# Patient Record
Sex: Female | Born: 1935 | Race: White | Hispanic: No | Smoking: Never smoker
Health system: Southern US, Community
[De-identification: ages and names within clinical notes are randomized; demographics above are authoritative.]

## PROBLEM LIST (undated history)

## (undated) ENCOUNTER — Emergency Department (HOSPITAL_COMMUNITY): Discharge status: Absent without leave | Payer: Medicare Other

## (undated) DIAGNOSIS — R490 Dysphonia: Secondary | ICD-10-CM

## (undated) DIAGNOSIS — R350 Frequency of micturition: Secondary | ICD-10-CM

## (undated) DIAGNOSIS — G629 Polyneuropathy, unspecified: Secondary | ICD-10-CM

## (undated) DIAGNOSIS — Z853 Personal history of malignant neoplasm of breast: Secondary | ICD-10-CM

## (undated) DIAGNOSIS — L719 Rosacea, unspecified: Secondary | ICD-10-CM

## (undated) DIAGNOSIS — K5900 Constipation, unspecified: Secondary | ICD-10-CM

## (undated) DIAGNOSIS — M949 Disorder of cartilage, unspecified: Secondary | ICD-10-CM

## (undated) DIAGNOSIS — M810 Age-related osteoporosis without current pathological fracture: Secondary | ICD-10-CM

## (undated) DIAGNOSIS — R269 Unspecified abnormalities of gait and mobility: Secondary | ICD-10-CM

## (undated) DIAGNOSIS — R8290 Unspecified abnormal findings in urine: Secondary | ICD-10-CM

## (undated) DIAGNOSIS — S12100A Unspecified displaced fracture of second cervical vertebra, initial encounter for closed fracture: Secondary | ICD-10-CM

## (undated) DIAGNOSIS — M899 Disorder of bone, unspecified: Secondary | ICD-10-CM

## (undated) DIAGNOSIS — M4802 Spinal stenosis, cervical region: Secondary | ICD-10-CM

## (undated) DIAGNOSIS — R32 Unspecified urinary incontinence: Secondary | ICD-10-CM

## (undated) DIAGNOSIS — R209 Unspecified disturbances of skin sensation: Secondary | ICD-10-CM

## (undated) DIAGNOSIS — R609 Edema, unspecified: Secondary | ICD-10-CM

## (undated) DIAGNOSIS — G3184 Mild cognitive impairment, so stated: Secondary | ICD-10-CM

## (undated) DIAGNOSIS — G4733 Obstructive sleep apnea (adult) (pediatric): Secondary | ICD-10-CM

## (undated) DIAGNOSIS — R0602 Shortness of breath: Secondary | ICD-10-CM

## (undated) DIAGNOSIS — M542 Cervicalgia: Secondary | ICD-10-CM

## (undated) DIAGNOSIS — D580 Hereditary spherocytosis: Secondary | ICD-10-CM

## (undated) DIAGNOSIS — K769 Liver disease, unspecified: Secondary | ICD-10-CM

## (undated) DIAGNOSIS — J309 Allergic rhinitis, unspecified: Secondary | ICD-10-CM

## (undated) DIAGNOSIS — I10 Essential (primary) hypertension: Secondary | ICD-10-CM

## (undated) DIAGNOSIS — R202 Paresthesia of skin: Secondary | ICD-10-CM

## (undated) DIAGNOSIS — G5600 Carpal tunnel syndrome, unspecified upper limb: Secondary | ICD-10-CM

## (undated) DIAGNOSIS — R252 Cramp and spasm: Secondary | ICD-10-CM

## (undated) DIAGNOSIS — N281 Cyst of kidney, acquired: Secondary | ICD-10-CM

## (undated) DIAGNOSIS — R3915 Urgency of urination: Secondary | ICD-10-CM

## (undated) DIAGNOSIS — E039 Hypothyroidism, unspecified: Secondary | ICD-10-CM

## (undated) DIAGNOSIS — N319 Neuromuscular dysfunction of bladder, unspecified: Secondary | ICD-10-CM

## (undated) DIAGNOSIS — E785 Hyperlipidemia, unspecified: Secondary | ICD-10-CM

## (undated) DIAGNOSIS — S02640A Fracture of ramus of mandible, unspecified side, initial encounter for closed fracture: Secondary | ICD-10-CM

## (undated) DIAGNOSIS — G609 Hereditary and idiopathic neuropathy, unspecified: Secondary | ICD-10-CM

## (undated) DIAGNOSIS — D1809 Hemangioma of other sites: Secondary | ICD-10-CM

## (undated) DIAGNOSIS — J310 Chronic rhinitis: Secondary | ICD-10-CM

## (undated) DIAGNOSIS — M48061 Spinal stenosis, lumbar region without neurogenic claudication: Secondary | ICD-10-CM

## (undated) DIAGNOSIS — C50919 Malignant neoplasm of unspecified site of unspecified female breast: Secondary | ICD-10-CM

## (undated) DIAGNOSIS — Z9181 History of falling: Principal | ICD-10-CM

## (undated) DIAGNOSIS — M1990 Unspecified osteoarthritis, unspecified site: Secondary | ICD-10-CM

## (undated) HISTORY — DX: Edema, unspecified: R60.9

## (undated) HISTORY — DX: Constipation, unspecified: K59.00

## (undated) HISTORY — DX: Dysphonia: R49.0

## (undated) HISTORY — DX: Paresthesia of skin: R20.2

## (undated) HISTORY — DX: Malignant neoplasm of unspecified site of unspecified female breast: C50.919

## (undated) HISTORY — DX: Hereditary spherocytosis: D58.0

## (undated) HISTORY — DX: Neuromuscular dysfunction of bladder, unspecified: N31.9

## (undated) HISTORY — DX: Unspecified osteoarthritis, unspecified site: M19.90

## (undated) HISTORY — DX: Shortness of breath: R06.02

## (undated) HISTORY — DX: Cramp and spasm: R25.2

## (undated) HISTORY — DX: Mild cognitive impairment, so stated: G31.84

## (undated) HISTORY — DX: Hypothyroidism, unspecified: E03.9

## (undated) HISTORY — DX: Rosacea, unspecified: L71.9

## (undated) HISTORY — DX: Cyst of kidney, acquired: N28.1

## (undated) HISTORY — DX: Unspecified displaced fracture of second cervical vertebra, initial encounter for closed fracture: S12.100A

## (undated) HISTORY — DX: Unspecified abnormal findings in urine: R82.90

## (undated) HISTORY — DX: Obstructive sleep apnea (adult) (pediatric): G47.33

## (undated) HISTORY — DX: Carpal tunnel syndrome, unspecified upper limb: G56.00

## (undated) HISTORY — DX: Cervicalgia: M54.2

## (undated) HISTORY — DX: Allergic rhinitis, unspecified: J30.9

## (undated) HISTORY — DX: Unspecified urinary incontinence: R32

## (undated) HISTORY — DX: Disorder of cartilage, unspecified: M94.9

## (undated) HISTORY — PX: BLADDER REPAIR: SHX76

## (undated) HISTORY — DX: Hyperlipidemia, unspecified: E78.5

## (undated) HISTORY — DX: Unspecified abnormalities of gait and mobility: R26.9

## (undated) HISTORY — DX: History of falling: Z91.81

## (undated) HISTORY — DX: Age-related osteoporosis without current pathological fracture: M81.0

## (undated) HISTORY — DX: Disorder of bone, unspecified: M89.9

## (undated) HISTORY — DX: Urgency of urination: R39.15

## (undated) HISTORY — DX: Fracture of ramus of mandible, unspecified side, initial encounter for closed fracture: S02.640A

## (undated) HISTORY — DX: Liver disease, unspecified: K76.9

## (undated) HISTORY — DX: Spinal stenosis, cervical region: M48.02

## (undated) HISTORY — DX: Frequency of micturition: R35.0

## (undated) HISTORY — DX: Hemangioma of other sites: D18.09

## (undated) HISTORY — DX: Unspecified disturbances of skin sensation: R20.9

## (undated) HISTORY — DX: Spinal stenosis, lumbar region without neurogenic claudication: M48.061

## (undated) HISTORY — PX: TONSILLECTOMY: SHX5217

## (undated) HISTORY — DX: Essential (primary) hypertension: I10

## (undated) HISTORY — DX: Chronic rhinitis: J31.0

## (undated) HISTORY — DX: Polyneuropathy, unspecified: G62.9

## (undated) HISTORY — DX: Hereditary and idiopathic neuropathy, unspecified: G60.9

## (undated) HISTORY — DX: Personal history of malignant neoplasm of breast: Z85.3

---

## 1972-12-22 HISTORY — PX: ABDOMINAL HYSTERECTOMY: SHX81

## 1995-05-04 DIAGNOSIS — M899 Disorder of bone, unspecified: Secondary | ICD-10-CM

## 1995-05-04 HISTORY — DX: Disorder of bone, unspecified: M89.9

## 1996-12-22 HISTORY — PX: BREAST LUMPECTOMY: SHX2

## 1997-05-24 DIAGNOSIS — C50919 Malignant neoplasm of unspecified site of unspecified female breast: Secondary | ICD-10-CM

## 1997-05-24 HISTORY — DX: Malignant neoplasm of unspecified site of unspecified female breast: C50.919

## 1998-01-22 ENCOUNTER — Ambulatory Visit (HOSPITAL_COMMUNITY): Admission: RE | Admit: 1998-01-22 | Discharge: 1998-01-22 | Payer: Self-pay | Admitting: Hematology and Oncology

## 1998-08-28 ENCOUNTER — Encounter: Payer: Self-pay | Admitting: Hematology and Oncology

## 1998-08-28 ENCOUNTER — Ambulatory Visit (HOSPITAL_COMMUNITY): Admission: RE | Admit: 1998-08-28 | Discharge: 1998-08-28 | Payer: Self-pay | Admitting: Hematology and Oncology

## 1999-02-20 ENCOUNTER — Ambulatory Visit (HOSPITAL_COMMUNITY): Admission: RE | Admit: 1999-02-20 | Discharge: 1999-02-20 | Payer: Self-pay | Admitting: Hematology and Oncology

## 1999-02-20 ENCOUNTER — Encounter: Payer: Self-pay | Admitting: Hematology and Oncology

## 1999-02-21 ENCOUNTER — Other Ambulatory Visit: Admission: RE | Admit: 1999-02-21 | Discharge: 1999-02-21 | Payer: Self-pay | Admitting: Obstetrics and Gynecology

## 1999-02-28 ENCOUNTER — Ambulatory Visit (HOSPITAL_COMMUNITY): Admission: RE | Admit: 1999-02-28 | Discharge: 1999-02-28 | Payer: Self-pay | Admitting: Gastroenterology

## 1999-02-28 HISTORY — PX: COLONOSCOPY: SHX174

## 1999-07-23 HISTORY — PX: CATARACT EXTRACTION W/ INTRAOCULAR LENS IMPLANT: SHX1309

## 1999-08-12 ENCOUNTER — Encounter: Payer: Self-pay | Admitting: Specialist

## 1999-08-14 ENCOUNTER — Ambulatory Visit (HOSPITAL_COMMUNITY): Admission: RE | Admit: 1999-08-14 | Discharge: 1999-08-14 | Payer: Self-pay | Admitting: Specialist

## 2000-01-14 DIAGNOSIS — J309 Allergic rhinitis, unspecified: Secondary | ICD-10-CM

## 2000-01-14 DIAGNOSIS — Z853 Personal history of malignant neoplasm of breast: Secondary | ICD-10-CM

## 2000-01-14 DIAGNOSIS — R252 Cramp and spasm: Secondary | ICD-10-CM

## 2000-01-14 DIAGNOSIS — D580 Hereditary spherocytosis: Secondary | ICD-10-CM

## 2000-01-14 HISTORY — DX: Personal history of malignant neoplasm of breast: Z85.3

## 2000-01-14 HISTORY — DX: Hereditary spherocytosis: D58.0

## 2000-01-14 HISTORY — DX: Allergic rhinitis, unspecified: J30.9

## 2000-01-14 HISTORY — DX: Cramp and spasm: R25.2

## 2000-02-25 ENCOUNTER — Encounter: Payer: Self-pay | Admitting: Surgery

## 2000-02-25 ENCOUNTER — Ambulatory Visit (HOSPITAL_COMMUNITY): Admission: RE | Admit: 2000-02-25 | Discharge: 2000-02-25 | Payer: Self-pay | Admitting: Surgery

## 2000-03-26 ENCOUNTER — Other Ambulatory Visit: Admission: RE | Admit: 2000-03-26 | Discharge: 2000-03-26 | Payer: Self-pay | Admitting: Obstetrics and Gynecology

## 2000-12-22 HISTORY — PX: CORNEAL TRANSPLANT: SHX108

## 2001-03-08 ENCOUNTER — Encounter: Payer: Self-pay | Admitting: Obstetrics and Gynecology

## 2001-03-08 ENCOUNTER — Encounter: Admission: RE | Admit: 2001-03-08 | Discharge: 2001-03-08 | Payer: Self-pay | Admitting: Obstetrics and Gynecology

## 2001-03-09 DIAGNOSIS — M810 Age-related osteoporosis without current pathological fracture: Secondary | ICD-10-CM

## 2001-03-09 DIAGNOSIS — E785 Hyperlipidemia, unspecified: Secondary | ICD-10-CM

## 2001-03-09 DIAGNOSIS — M1990 Unspecified osteoarthritis, unspecified site: Secondary | ICD-10-CM

## 2001-03-09 HISTORY — DX: Hyperlipidemia, unspecified: E78.5

## 2001-03-09 HISTORY — DX: Age-related osteoporosis without current pathological fracture: M81.0

## 2001-03-09 HISTORY — DX: Unspecified osteoarthritis, unspecified site: M19.90

## 2001-03-14 ENCOUNTER — Ambulatory Visit (HOSPITAL_BASED_OUTPATIENT_CLINIC_OR_DEPARTMENT_OTHER): Admission: RE | Admit: 2001-03-14 | Discharge: 2001-03-14 | Payer: Self-pay | Admitting: Internal Medicine

## 2001-04-01 ENCOUNTER — Other Ambulatory Visit: Admission: RE | Admit: 2001-04-01 | Discharge: 2001-04-01 | Payer: Self-pay | Admitting: Obstetrics and Gynecology

## 2001-04-19 DIAGNOSIS — E039 Hypothyroidism, unspecified: Secondary | ICD-10-CM

## 2001-04-19 HISTORY — DX: Hypothyroidism, unspecified: E03.9

## 2001-10-12 ENCOUNTER — Encounter (INDEPENDENT_AMBULATORY_CARE_PROVIDER_SITE_OTHER): Payer: Self-pay

## 2001-10-12 ENCOUNTER — Other Ambulatory Visit: Admission: RE | Admit: 2001-10-12 | Discharge: 2001-10-12 | Payer: Self-pay | Admitting: *Deleted

## 2002-03-10 ENCOUNTER — Encounter: Admission: RE | Admit: 2002-03-10 | Discharge: 2002-03-10 | Payer: Self-pay | Admitting: Obstetrics and Gynecology

## 2002-03-10 ENCOUNTER — Encounter: Payer: Self-pay | Admitting: Obstetrics and Gynecology

## 2002-04-04 ENCOUNTER — Other Ambulatory Visit: Admission: RE | Admit: 2002-04-04 | Discharge: 2002-04-04 | Payer: Self-pay | Admitting: Obstetrics and Gynecology

## 2002-08-02 DIAGNOSIS — I10 Essential (primary) hypertension: Secondary | ICD-10-CM

## 2002-08-02 HISTORY — DX: Essential (primary) hypertension: I10

## 2003-03-29 ENCOUNTER — Encounter: Admission: RE | Admit: 2003-03-29 | Discharge: 2003-03-29 | Payer: Self-pay | Admitting: Obstetrics and Gynecology

## 2003-03-29 ENCOUNTER — Encounter: Payer: Self-pay | Admitting: Obstetrics and Gynecology

## 2003-04-17 ENCOUNTER — Other Ambulatory Visit: Admission: RE | Admit: 2003-04-17 | Discharge: 2003-04-17 | Payer: Self-pay | Admitting: Obstetrics and Gynecology

## 2004-04-02 ENCOUNTER — Encounter: Admission: RE | Admit: 2004-04-02 | Discharge: 2004-04-02 | Payer: Self-pay | Admitting: Internal Medicine

## 2004-11-05 ENCOUNTER — Ambulatory Visit (HOSPITAL_COMMUNITY): Admission: RE | Admit: 2004-11-05 | Discharge: 2004-11-05 | Payer: Self-pay | Admitting: Gastroenterology

## 2005-04-03 ENCOUNTER — Encounter: Admission: RE | Admit: 2005-04-03 | Discharge: 2005-04-03 | Payer: Self-pay | Admitting: Internal Medicine

## 2005-11-11 DIAGNOSIS — R269 Unspecified abnormalities of gait and mobility: Secondary | ICD-10-CM

## 2005-11-11 DIAGNOSIS — R209 Unspecified disturbances of skin sensation: Secondary | ICD-10-CM

## 2005-11-11 HISTORY — DX: Unspecified abnormalities of gait and mobility: R26.9

## 2005-11-11 HISTORY — DX: Unspecified disturbances of skin sensation: R20.9

## 2006-02-24 ENCOUNTER — Ambulatory Visit (HOSPITAL_BASED_OUTPATIENT_CLINIC_OR_DEPARTMENT_OTHER): Admission: RE | Admit: 2006-02-24 | Discharge: 2006-02-24 | Payer: Self-pay | Admitting: Internal Medicine

## 2006-03-01 ENCOUNTER — Ambulatory Visit: Payer: Self-pay | Admitting: Internal Medicine

## 2006-04-21 DIAGNOSIS — G4733 Obstructive sleep apnea (adult) (pediatric): Secondary | ICD-10-CM

## 2006-04-21 HISTORY — DX: Obstructive sleep apnea (adult) (pediatric): G47.33

## 2006-04-22 ENCOUNTER — Encounter: Admission: RE | Admit: 2006-04-22 | Discharge: 2006-04-22 | Payer: Self-pay | Admitting: Internal Medicine

## 2007-02-23 DIAGNOSIS — K5900 Constipation, unspecified: Secondary | ICD-10-CM

## 2007-02-23 DIAGNOSIS — N319 Neuromuscular dysfunction of bladder, unspecified: Secondary | ICD-10-CM

## 2007-02-23 HISTORY — DX: Neuromuscular dysfunction of bladder, unspecified: N31.9

## 2007-02-23 HISTORY — DX: Constipation, unspecified: K59.00

## 2007-04-30 ENCOUNTER — Encounter: Admission: RE | Admit: 2007-04-30 | Discharge: 2007-04-30 | Payer: Self-pay | Admitting: Internal Medicine

## 2007-05-04 ENCOUNTER — Encounter: Admission: RE | Admit: 2007-05-04 | Discharge: 2007-05-04 | Payer: Self-pay | Admitting: Internal Medicine

## 2007-08-23 DIAGNOSIS — D1809 Hemangioma of other sites: Secondary | ICD-10-CM

## 2007-08-23 DIAGNOSIS — N281 Cyst of kidney, acquired: Secondary | ICD-10-CM

## 2007-08-23 HISTORY — DX: Cyst of kidney, acquired: N28.1

## 2007-08-23 HISTORY — DX: Hemangioma of other sites: D18.09

## 2007-08-31 DIAGNOSIS — K769 Liver disease, unspecified: Secondary | ICD-10-CM

## 2007-08-31 HISTORY — DX: Liver disease, unspecified: K76.9

## 2008-02-28 LAB — HM COLONOSCOPY

## 2008-05-01 ENCOUNTER — Encounter: Admission: RE | Admit: 2008-05-01 | Discharge: 2008-05-01 | Payer: Self-pay | Admitting: Internal Medicine

## 2008-10-24 DIAGNOSIS — L719 Rosacea, unspecified: Secondary | ICD-10-CM

## 2008-10-24 HISTORY — DX: Rosacea, unspecified: L71.9

## 2009-05-04 ENCOUNTER — Encounter: Admission: RE | Admit: 2009-05-04 | Discharge: 2009-05-04 | Payer: Self-pay | Admitting: Internal Medicine

## 2009-05-08 DIAGNOSIS — G609 Hereditary and idiopathic neuropathy, unspecified: Secondary | ICD-10-CM

## 2009-05-08 HISTORY — DX: Hereditary and idiopathic neuropathy, unspecified: G60.9

## 2009-08-13 ENCOUNTER — Encounter: Admission: RE | Admit: 2009-08-13 | Discharge: 2009-08-13 | Payer: Self-pay | Admitting: Internal Medicine

## 2009-08-13 DIAGNOSIS — M48061 Spinal stenosis, lumbar region without neurogenic claudication: Secondary | ICD-10-CM

## 2009-08-13 HISTORY — DX: Spinal stenosis, lumbar region without neurogenic claudication: M48.061

## 2009-09-11 ENCOUNTER — Encounter: Admission: RE | Admit: 2009-09-11 | Discharge: 2009-09-11 | Payer: Self-pay | Admitting: Internal Medicine

## 2009-10-12 DIAGNOSIS — M48061 Spinal stenosis, lumbar region without neurogenic claudication: Secondary | ICD-10-CM | POA: Insufficient documentation

## 2009-11-20 DIAGNOSIS — M542 Cervicalgia: Secondary | ICD-10-CM

## 2009-11-20 DIAGNOSIS — R0602 Shortness of breath: Secondary | ICD-10-CM

## 2009-11-20 HISTORY — DX: Shortness of breath: R06.02

## 2009-11-20 HISTORY — DX: Cervicalgia: M54.2

## 2009-11-26 ENCOUNTER — Encounter: Admission: RE | Admit: 2009-11-26 | Discharge: 2009-11-26 | Payer: Self-pay | Admitting: Internal Medicine

## 2009-11-29 ENCOUNTER — Ambulatory Visit (HOSPITAL_COMMUNITY): Admission: RE | Admit: 2009-11-29 | Discharge: 2009-11-29 | Payer: Self-pay | Admitting: Internal Medicine

## 2010-05-30 ENCOUNTER — Encounter: Admission: RE | Admit: 2010-05-30 | Discharge: 2010-05-30 | Payer: Self-pay | Admitting: Internal Medicine

## 2010-05-30 LAB — HM MAMMOGRAPHY: HM Mammogram: NORMAL

## 2010-06-11 DIAGNOSIS — S02640A Fracture of ramus of mandible, unspecified side, initial encounter for closed fracture: Secondary | ICD-10-CM

## 2010-06-11 HISTORY — DX: Fracture of ramus of mandible, unspecified side, initial encounter for closed fracture: S02.640A

## 2010-07-30 DIAGNOSIS — R350 Frequency of micturition: Secondary | ICD-10-CM

## 2010-07-30 HISTORY — DX: Frequency of micturition: R35.0

## 2010-09-04 ENCOUNTER — Encounter: Admission: RE | Admit: 2010-09-04 | Discharge: 2010-09-04 | Payer: Self-pay | Admitting: Internal Medicine

## 2010-09-10 DIAGNOSIS — M4802 Spinal stenosis, cervical region: Secondary | ICD-10-CM

## 2010-09-10 DIAGNOSIS — S12100A Unspecified displaced fracture of second cervical vertebra, initial encounter for closed fracture: Secondary | ICD-10-CM

## 2010-09-10 HISTORY — DX: Spinal stenosis, cervical region: M48.02

## 2010-09-10 HISTORY — DX: Unspecified displaced fracture of second cervical vertebra, initial encounter for closed fracture: S12.100A

## 2010-10-04 ENCOUNTER — Encounter: Admission: RE | Admit: 2010-10-04 | Discharge: 2010-10-04 | Payer: Self-pay | Admitting: Internal Medicine

## 2010-10-09 DIAGNOSIS — R609 Edema, unspecified: Secondary | ICD-10-CM | POA: Insufficient documentation

## 2010-10-09 HISTORY — DX: Edema, unspecified: R60.9

## 2010-11-19 ENCOUNTER — Encounter: Admission: RE | Admit: 2010-11-19 | Discharge: 2010-11-19 | Payer: Self-pay | Admitting: Internal Medicine

## 2011-01-11 ENCOUNTER — Encounter: Payer: Self-pay | Admitting: Internal Medicine

## 2011-01-12 ENCOUNTER — Encounter: Payer: Self-pay | Admitting: Internal Medicine

## 2011-03-25 LAB — BLOOD GAS, ARTERIAL
Acid-Base Excess: 2.5 mmol/L — ABNORMAL HIGH (ref 0.0–2.0)
Bicarbonate: 26.4 mEq/L — ABNORMAL HIGH (ref 20.0–24.0)
TCO2: 27.6 mmol/L (ref 0–100)
pCO2 arterial: 40.3 mmHg (ref 35.0–45.0)
pH, Arterial: 7.432 — ABNORMAL HIGH (ref 7.350–7.400)
pO2, Arterial: 80.2 mmHg (ref 80.0–100.0)

## 2011-04-21 ENCOUNTER — Other Ambulatory Visit: Payer: Self-pay | Admitting: Internal Medicine

## 2011-04-21 DIAGNOSIS — Z1231 Encounter for screening mammogram for malignant neoplasm of breast: Secondary | ICD-10-CM

## 2011-05-09 NOTE — Op Note (Signed)
NAME:  Gina Bolton, Gina Bolton            ACCOUNT NO.:  [id number]   MEDICAL RECORD NO.:  [id number]          PATIENT TYPE:  AMB   LOCATION:  ENDO                         FACILITY:  [facility]   PHYSICIAN:  James L. Malon Kindle., M.D.DATE OF BIRTH:  1920-06-13   DATE OF PROCEDURE:  11/05/2004  DATE OF DISCHARGE:                                 OPERATIVE REPORT   PROCEDURE PERFORMED:  Colonoscopy.   ENDOSCOPIST:  Llana Aliment. Edwards, M.D.   MEDICATIONS:  Fentanyl 60 mcg, Versed 7 mg IV.   INDICATIONS FOR PROCEDURE:  Strong family history of colon cancer in mother.   INSTRUMENT USED:  Pediatric adjustable colonoscope.   DESCRIPTION OF PROCEDURE:  The procedure had been explained to the patient  and consent obtained.  With the patient in the left lateral decubitus  position, the pediatric adjustable Olympus video colonoscope was inserted  and advanced.  The prep was excellent and we were able to reach the cecum  without difficulty.  The ileocecal valve and appendiceal orifice were seen.  The scope was withdrawn and the cecum, ascending colon, transverse colon,  splenic flexure, descending and sigmoid colon were seen well upon removal.  No polyps or other lesions were seen.  The scope was withdrawn.  The patient  tolerated the procedure well.   ASSESSMENT:  Strong family history of colon cancer with negative  colonoscopy, V160.   PLAN:  Will recommend yearly Hemoccults and repeat procedure in five years.       JLE/MEDQ  D:  11/05/2004  T:  11/05/2004  Job:  [id number]   cc:   Lenon Curt. Chilton Si, M.D.  [address]  [place]  [place] [address]  Fax: [phone]

## 2011-05-09 NOTE — Procedures (Signed)
NAME:  Gina Bolton, Gina Bolton            ACCOUNT NO.:  [id number]   MEDICAL RECORD NO.:  [id number]          PATIENT TYPE:  OUT   LOCATION:  SLEEP CENTER                 FACILITY:  [facility]   PHYSICIAN:  Clinton D. Maple Hudson, M.D. DATE OF BIRTH:  11/12/1920   DATE OF STUDY:  02/24/2006                              NOCTURNAL POLYSOMNOGRAM   REFERRING PHYSICIAN:  Dr. Murray Hodgkins.   DATE OF STUDY:  February 24, 2006.   INDICATION FOR STUDY:  Hypersomnia with sleep apnea. Epworth sleepiness  score 11/24, BMI 24, weight 150 pounds. Home medications: Evista,  hydrochlorothiazide, Restasis eye drops, Estrace cream, metronidazole. A  diagnostic NPSG on March 14, 2001 had recorded an AHI of 34 per hour.   SLEEP ARCHITECTURE:  Short total sleep time 222 minutes with sleep  efficiency 53%. Stage I was 7%, stage II 84%, stages III and IV 9% and REM  was absent. Sleep latency 49 minutes, REM latency 261 minutes, awake after  sleep onset 144 minutes. After a brief sleep interval starting at 11 p.m.,  she was awake until almost 2 a.m. before regaining sleep and was frustrated  and sensitive to the monitoring environment although she indicated the bed  was comfortable. No bedtime medication was reported.   RESPIRATORY DATA:  Apnea-hypopnea index (AHI, RDI) 51.9 obstructive events  per hour indicating severe obstructive sleep apnea/hypopnea syndrome. This  included 2 central apneas, 138 obstructive apneas and 52 hypopneas. Events  were recorded while on right side and supine, not positional. REM AHI N/A.  Because she was unable to sustain sleep during the first half of the night,  there was insufficient time to permit CPAP titration by split protocol on  this study night.   OXYGEN DATA:  Loud intermittent snorting and snoring with oxygen  desaturation to a nadir of 68%. Mean oxygen saturation through the study was  92% on room air.   CARDIAC DATA:  Normal sinus rhythm.   MOVEMENT/PARASOMNIA:  No unusual body  movement or behavior.   IMPRESSION/RECOMMENDATIONS:  1.  Severe obstructive sleep apnea/hypopnea syndrome, apnea-hypopnea index      51.9 per hour with nonpositional events, loud intermittent snoring and      oxygen desaturation to 68%.  2.  The patient was unable to maintain sleep during the first half of the      night. Consider return for continuous      positive airway pressure titration bringing a sleep medication if      appropriate. Otherwise consider alternative therapies as clinically      indicated.      Clinton D. Maple Hudson, M.D.  Diplomate, Biomedical engineer of Sleep Medicine  Electronically Signed     CDY/MEDQ  D:  03/01/2006 16:18:05  T:  03/02/2006 19:59:04  Job:  [id number]

## 2011-08-26 DIAGNOSIS — R3915 Urgency of urination: Secondary | ICD-10-CM

## 2011-08-26 HISTORY — DX: Urgency of urination: R39.15

## 2011-09-11 ENCOUNTER — Ambulatory Visit: Payer: Self-pay

## 2011-10-08 ENCOUNTER — Ambulatory Visit
Admission: RE | Admit: 2011-10-08 | Discharge: 2011-10-08 | Disposition: A | Discharge status: Home / Self Care | Payer: Medicare Other | Source: Ambulatory Visit | Attending: Internal Medicine | Admitting: Internal Medicine

## 2011-10-08 DIAGNOSIS — Z1231 Encounter for screening mammogram for malignant neoplasm of breast: Secondary | ICD-10-CM

## 2011-12-02 DIAGNOSIS — J310 Chronic rhinitis: Secondary | ICD-10-CM

## 2011-12-02 DIAGNOSIS — G3184 Mild cognitive impairment, so stated: Secondary | ICD-10-CM

## 2011-12-02 HISTORY — DX: Mild cognitive impairment of uncertain or unknown etiology: G31.84

## 2011-12-02 HISTORY — DX: Chronic rhinitis: J31.0

## 2012-01-08 DIAGNOSIS — J310 Chronic rhinitis: Secondary | ICD-10-CM | POA: Diagnosis not present

## 2012-03-29 DIAGNOSIS — E785 Hyperlipidemia, unspecified: Secondary | ICD-10-CM | POA: Diagnosis not present

## 2012-03-29 DIAGNOSIS — I10 Essential (primary) hypertension: Secondary | ICD-10-CM | POA: Diagnosis not present

## 2012-03-29 DIAGNOSIS — G3184 Mild cognitive impairment, so stated: Secondary | ICD-10-CM | POA: Diagnosis not present

## 2012-04-06 DIAGNOSIS — I10 Essential (primary) hypertension: Secondary | ICD-10-CM | POA: Diagnosis not present

## 2012-04-06 DIAGNOSIS — M542 Cervicalgia: Secondary | ICD-10-CM | POA: Diagnosis not present

## 2012-04-06 DIAGNOSIS — E785 Hyperlipidemia, unspecified: Secondary | ICD-10-CM | POA: Diagnosis not present

## 2012-04-06 DIAGNOSIS — E039 Hypothyroidism, unspecified: Secondary | ICD-10-CM | POA: Diagnosis not present

## 2012-04-06 DIAGNOSIS — C50919 Malignant neoplasm of unspecified site of unspecified female breast: Secondary | ICD-10-CM | POA: Diagnosis not present

## 2012-06-26 DIAGNOSIS — N390 Urinary tract infection, site not specified: Secondary | ICD-10-CM | POA: Diagnosis not present

## 2012-07-06 DIAGNOSIS — R82998 Other abnormal findings in urine: Secondary | ICD-10-CM | POA: Diagnosis not present

## 2012-07-06 DIAGNOSIS — R3915 Urgency of urination: Secondary | ICD-10-CM | POA: Diagnosis not present

## 2012-08-06 DIAGNOSIS — N390 Urinary tract infection, site not specified: Secondary | ICD-10-CM | POA: Diagnosis not present

## 2012-08-06 DIAGNOSIS — R3915 Urgency of urination: Secondary | ICD-10-CM | POA: Diagnosis not present

## 2012-10-05 DIAGNOSIS — M542 Cervicalgia: Secondary | ICD-10-CM | POA: Diagnosis not present

## 2012-10-05 DIAGNOSIS — I10 Essential (primary) hypertension: Secondary | ICD-10-CM | POA: Diagnosis not present

## 2012-10-05 DIAGNOSIS — N390 Urinary tract infection, site not specified: Secondary | ICD-10-CM | POA: Diagnosis not present

## 2012-10-13 DIAGNOSIS — Z23 Encounter for immunization: Secondary | ICD-10-CM | POA: Diagnosis not present

## 2012-11-22 DIAGNOSIS — N3020 Other chronic cystitis without hematuria: Secondary | ICD-10-CM | POA: Diagnosis not present

## 2013-01-17 DIAGNOSIS — E785 Hyperlipidemia, unspecified: Secondary | ICD-10-CM | POA: Diagnosis not present

## 2013-01-17 DIAGNOSIS — R3915 Urgency of urination: Secondary | ICD-10-CM | POA: Diagnosis not present

## 2013-01-17 DIAGNOSIS — I10 Essential (primary) hypertension: Secondary | ICD-10-CM | POA: Diagnosis not present

## 2013-01-25 DIAGNOSIS — E785 Hyperlipidemia, unspecified: Secondary | ICD-10-CM | POA: Diagnosis not present

## 2013-01-25 DIAGNOSIS — G4733 Obstructive sleep apnea (adult) (pediatric): Secondary | ICD-10-CM | POA: Diagnosis not present

## 2013-01-25 DIAGNOSIS — I10 Essential (primary) hypertension: Secondary | ICD-10-CM | POA: Diagnosis not present

## 2013-01-25 DIAGNOSIS — E039 Hypothyroidism, unspecified: Secondary | ICD-10-CM | POA: Diagnosis not present

## 2013-01-25 DIAGNOSIS — C50919 Malignant neoplasm of unspecified site of unspecified female breast: Secondary | ICD-10-CM | POA: Diagnosis not present

## 2013-01-25 DIAGNOSIS — R252 Cramp and spasm: Secondary | ICD-10-CM | POA: Diagnosis not present

## 2013-01-25 DIAGNOSIS — R490 Dysphonia: Secondary | ICD-10-CM

## 2013-01-25 HISTORY — DX: Dysphonia: R49.0

## 2013-01-28 DIAGNOSIS — R490 Dysphonia: Secondary | ICD-10-CM | POA: Diagnosis not present

## 2013-01-28 DIAGNOSIS — J309 Allergic rhinitis, unspecified: Secondary | ICD-10-CM | POA: Diagnosis not present

## 2013-02-23 DIAGNOSIS — H02059 Trichiasis without entropian unspecified eye, unspecified eyelid: Secondary | ICD-10-CM | POA: Diagnosis not present

## 2013-04-25 DIAGNOSIS — T1500XA Foreign body in cornea, unspecified eye, initial encounter: Secondary | ICD-10-CM | POA: Diagnosis not present

## 2013-04-26 DIAGNOSIS — T1500XA Foreign body in cornea, unspecified eye, initial encounter: Secondary | ICD-10-CM | POA: Diagnosis not present

## 2013-05-26 DIAGNOSIS — H353190 Nonexudative age-related macular degeneration, unspecified eye, stage unspecified: Secondary | ICD-10-CM | POA: Diagnosis not present

## 2013-07-04 DIAGNOSIS — N3090 Cystitis, unspecified without hematuria: Secondary | ICD-10-CM | POA: Diagnosis not present

## 2013-08-29 DIAGNOSIS — I10 Essential (primary) hypertension: Secondary | ICD-10-CM | POA: Diagnosis not present

## 2013-08-29 DIAGNOSIS — E039 Hypothyroidism, unspecified: Secondary | ICD-10-CM | POA: Diagnosis not present

## 2013-09-06 ENCOUNTER — Non-Acute Institutional Stay: Payer: Medicare Other | Admitting: Internal Medicine

## 2013-09-06 ENCOUNTER — Encounter: Payer: Self-pay | Admitting: Internal Medicine

## 2013-09-06 VITALS — BP 132/82 | HR 62 | Ht 64.5 in | Wt 138.0 lb

## 2013-09-06 DIAGNOSIS — R202 Paresthesia of skin: Secondary | ICD-10-CM

## 2013-09-06 DIAGNOSIS — R609 Edema, unspecified: Secondary | ICD-10-CM

## 2013-09-06 DIAGNOSIS — E785 Hyperlipidemia, unspecified: Secondary | ICD-10-CM

## 2013-09-06 DIAGNOSIS — M542 Cervicalgia: Secondary | ICD-10-CM

## 2013-09-06 DIAGNOSIS — R209 Unspecified disturbances of skin sensation: Secondary | ICD-10-CM | POA: Diagnosis not present

## 2013-09-06 DIAGNOSIS — I10 Essential (primary) hypertension: Secondary | ICD-10-CM

## 2013-09-06 DIAGNOSIS — M4802 Spinal stenosis, cervical region: Secondary | ICD-10-CM | POA: Diagnosis not present

## 2013-09-06 DIAGNOSIS — E039 Hypothyroidism, unspecified: Secondary | ICD-10-CM

## 2013-09-06 DIAGNOSIS — G4733 Obstructive sleep apnea (adult) (pediatric): Secondary | ICD-10-CM

## 2013-09-06 DIAGNOSIS — R413 Other amnesia: Secondary | ICD-10-CM

## 2013-09-06 NOTE — Progress Notes (Signed)
Passed clock drawing 

## 2013-09-20 DIAGNOSIS — Z23 Encounter for immunization: Secondary | ICD-10-CM | POA: Diagnosis not present

## 2013-10-04 ENCOUNTER — Non-Acute Institutional Stay: Payer: Medicare Other | Admitting: Internal Medicine

## 2013-10-04 ENCOUNTER — Encounter: Payer: Self-pay | Admitting: Internal Medicine

## 2013-10-04 VITALS — BP 140/88 | HR 62 | Ht 64.5 in | Wt 137.0 lb

## 2013-10-04 DIAGNOSIS — S41109A Unspecified open wound of unspecified upper arm, initial encounter: Secondary | ICD-10-CM | POA: Insufficient documentation

## 2013-10-04 DIAGNOSIS — S41102A Unspecified open wound of left upper arm, initial encounter: Secondary | ICD-10-CM

## 2013-10-04 DIAGNOSIS — S300XXA Contusion of lower back and pelvis, initial encounter: Secondary | ICD-10-CM | POA: Insufficient documentation

## 2013-10-04 NOTE — Patient Instructions (Signed)
Continue current medications. You will be sore for several days. Take a laxative if constipation occurs.

## 2013-10-04 NOTE — Progress Notes (Signed)
  Subjective:    Patient ID: Gina Bolton, female    DOB: 12-03-1920, 79 y.o.   MRN: [id number]  Chief Complaint  Patient presents with  . Fall    patient fell about 10:30 this morning. Got a new pair of shoes, fell backwars on buttocks, hit right arm, skin tear left elbow, has a little headache.      HPI No syncope. Did not hit head or hurt neck. Hurting at coccyx.  Skin tear of the left forearm.   Review of Systems  Constitutional: Negative for fever, chills, diaphoresis, activity change, appetite change and fatigue.  HENT: Positive for hearing loss. Negative for congestion, mouth sores, rhinorrhea and sinus pressure.   Eyes: Negative.   Respiratory: Negative.   Cardiovascular: Negative for chest pain, palpitations and leg swelling.  Gastrointestinal: Negative for abdominal pain and abdominal distention.  Endocrine: Negative.   Genitourinary: Negative.   Musculoskeletal: Negative for back pain, gait problem, joint swelling, neck pain and neck stiffness.       Tender at the coccyx. No deformity or bruising.  Skin:       One inch skin tear of the left forearm.  Allergic/Immunologic: Negative.   Neurological: Negative for dizziness, tremors, seizures, syncope, facial asymmetry, speech difficulty, weakness, light-headedness, numbness and headaches.  Hematological: Negative.   Psychiatric/Behavioral: Negative.        Objective:BP 140/88  Pulse 62  Ht 5' 4.5" (1.638 m)  Wt 137 lb (62.143 kg)  BMI 23.16 kg/m2    Physical Exam  Constitutional: She is oriented to person, place, and time. She appears well-developed and well-nourished. No distress.  HENT:  Head: Normocephalic and atraumatic.  Right Ear: External ear normal.  Left Ear: External ear normal.  Nose: Nose normal.  Mouth/Throat: Oropharynx is clear and moist.  Eyes: Conjunctivae and EOM are normal. Pupils are equal, round, and reactive to light.  Corrective lenses  Neck: Neck supple. No JVD present. No  tracheal deviation present. No thyromegaly present.  Cardiovascular: Normal rate, regular rhythm, normal heart sounds and intact distal pulses.  Exam reveals no gallop and no friction rub.   No murmur heard. Pulmonary/Chest: No respiratory distress. She has no wheezes. She exhibits no tenderness.  Abdominal: She exhibits no distension and no mass. There is no tenderness.  Musculoskeletal: Normal range of motion. She exhibits no edema and no tenderness.  Tender at coccyx.  Lymphadenopathy:    She has no cervical adenopathy.  Neurological: She is alert and oriented to person, place, and time. She has normal reflexes. No cranial nerve deficit. Coordination normal.  Skin: No rash noted. No erythema. No pallor.  Psychiatric: She has a normal mood and affect. Her behavior is normal. Judgment and thought content normal.          Assessment & Plan:  Open wound of arm, left, initial encounter: skin tear left forearm. Cleanse daily with soap and water and reapply fresh bandage.  Coccyx contusion, initial encounter: continue daily walking

## 2013-10-10 DIAGNOSIS — R413 Other amnesia: Secondary | ICD-10-CM | POA: Insufficient documentation

## 2013-10-10 DIAGNOSIS — G629 Polyneuropathy, unspecified: Secondary | ICD-10-CM | POA: Insufficient documentation

## 2013-10-10 HISTORY — DX: Polyneuropathy, unspecified: G62.9

## 2013-10-10 NOTE — Progress Notes (Signed)
  Subjective:    Patient ID: Gina Bolton, female    DOB: 11-Oct-1920, 79 y.o.   MRN: [id number]  Chief Complaint  Patient presents with  . Medical Managment of Chronic Issues    blood pressure, thyroid, memory    HPI Paresthesia of bilateral legs: numb feeling in both legs mch of the time. Does not disturb sleep. No pain.  Unspecified essential hypertension: controlled  Spinal stenosis in cervical region: chronic. Stable  Cervicalgia: improved  Obstructive sleep apnea (adult) (pediatric): using CPAP nightly  Unspecified hypothyroidism: controlled  Other and unspecified hyperlipidemia: controlled  Memory disturbance: screening is norma  Edema: mild chronic     Review of Systems  Constitutional: Negative for fever, chills, diaphoresis, activity change, appetite change and fatigue.  HENT: Positive for hearing loss. Negative for congestion and ear pain.   Eyes: Negative.   Respiratory: Negative for cough, choking, chest tightness and wheezing.   Cardiovascular: Negative for chest pain and leg swelling.  Gastrointestinal: Positive for constipation. Negative for nausea, abdominal pain, diarrhea and abdominal distention.  Endocrine: Negative for cold intolerance, heat intolerance, polydipsia, polyphagia and polyuria.  Genitourinary: Positive for frequency. Negative for dysuria.  Musculoskeletal: Positive for arthralgias, back pain, myalgias and neck pain.       Difficulty with balance.  Skin: Negative.   Allergic/Immunologic: Negative.   Neurological: Negative for dizziness, tremors, seizures, facial asymmetry, speech difficulty, weakness, light-headedness and numbness.  Hematological: Negative.   Psychiatric/Behavioral: Negative.        Objective:BP 132/82  Pulse 62  Ht 5' 4.5" (1.638 m)  Wt 138 lb (62.596 kg)  BMI 23.33 kg/m2    Physical Exam  Constitutional: She is oriented to person, place, and time. She appears well-developed and well-nourished. No  distress.  HENT:  Head: Normocephalic and atraumatic.  Right Ear: External ear normal.  Left Ear: External ear normal.  Nose: Nose normal.  Mouth/Throat: Oropharynx is clear and moist.  Partial hearing loss. Bilateral aids.  Eyes: Conjunctivae and EOM are normal. Pupils are equal, round, and reactive to light.  Neck: No JVD present. No tracheal deviation present. No thyromegaly present.  Hx CS vertebral fx.  Cardiovascular: Normal rate, regular rhythm, normal heart sounds and intact distal pulses.  Exam reveals no gallop and no friction rub.   No murmur heard. Varicose veins.  Pulmonary/Chest: No respiratory distress. She has no wheezes. She has no rales.  Abdominal: She exhibits no distension and no mass. There is no tenderness.  Musculoskeletal: Normal range of motion. She exhibits no edema and no tenderness.  Left hammer toe.  Neurological: She is alert and oriented to person, place, and time. She has normal reflexes. No cranial nerve deficit. Coordination normal.  09/06/13 MMSE 30/30. Passed clock drawing. Reduced vibratory sensation in both feet.  Skin: No rash noted. No erythema. No pallor.  Onychomycosis.  Psychiatric: She has a normal mood and affect. Her behavior is normal. Judgment and thought content normal.      LAB REVIEW 08/29/13 TSH 3.068    Assessment & Plan:  Paresthesia of bilateral legs: observe  Unspecified essential hypertension: controlled  Spinal stenosis in cervical region:chronic  Cervicalgia: improved  Obstructive sleep apnea (adult) (pediatric); continue CPAP  Unspecified hypothyroidism: controlled  Other and unspecified hyperlipidemia: controlled  Memory disturbance: obsereve  Edema: improved

## 2013-10-10 NOTE — Patient Instructions (Signed)
Continue current medications. 

## 2013-10-25 ENCOUNTER — Encounter: Payer: Self-pay | Admitting: Internal Medicine

## 2013-11-28 DIAGNOSIS — L259 Unspecified contact dermatitis, unspecified cause: Secondary | ICD-10-CM | POA: Diagnosis not present

## 2013-11-28 DIAGNOSIS — N3020 Other chronic cystitis without hematuria: Secondary | ICD-10-CM | POA: Diagnosis not present

## 2013-11-30 ENCOUNTER — Ambulatory Visit (INDEPENDENT_AMBULATORY_CARE_PROVIDER_SITE_OTHER): Payer: Medicare Other | Admitting: Internal Medicine

## 2013-11-30 ENCOUNTER — Encounter: Payer: Self-pay | Admitting: Internal Medicine

## 2013-11-30 VITALS — BP 164/90 | HR 73 | Temp 98.0°F | Resp 14 | Wt 138.2 lb

## 2013-11-30 DIAGNOSIS — B349 Viral infection, unspecified: Secondary | ICD-10-CM | POA: Insufficient documentation

## 2013-11-30 DIAGNOSIS — B9789 Other viral agents as the cause of diseases classified elsewhere: Secondary | ICD-10-CM | POA: Diagnosis not present

## 2013-11-30 MED ORDER — LEVOFLOXACIN 500 MG PO TABS: ORAL_TABLET | ORAL | Status: DC

## 2013-11-30 NOTE — Patient Instructions (Addendum)
Continue current medications Call for fever > 101 or change in sputum.

## 2013-11-30 NOTE — Progress Notes (Signed)
Subjective:    Patient ID: Gina Bolton, female    DOB: August 22, 1920, 79 y.o.   MRN: [id number]  Chief Complaint  Patient presents with  . Acute Visit    runny nose, ST, feverish, chest congestion/cough with clear phlegm, and no appetite x 1 1/2 weeks.  she is on antibotics for UTI since 12/8.      HPI Symptoms as in CC. Sick for about 10 days. family encouraged her to get evaluated for possible pneumonia. Symptoms most consistent with viral syndrome: low grade fever, initial sore throat that has improved, and increasing bronchial symptoms. No nausea or diarrhea.  Current Outpatient Prescriptions on File Prior to Visit  Medication Sig Dispense Refill  . Calcium Carb-Cholecalciferol (CALCIUM + D3) 600-200 MG-UNIT TABS Take by mouth. Take one tablet twice daily      . Cholecalciferol (VITAMIN D-400 PO) Take by mouth. Take one daily for vitamin D supplement      . cycloSPORINE (RESTASIS) 0.05 % ophthalmic emulsion 1 drop every 12 (twelve) hours. One drop into left eye twice daily      . desonide (DESOWEN) 0.05 % cream Apply topically 2 (two) times daily. As needed      . estradiol (ESTRACE) 0.1 MG/GM vaginal cream Place vaginally. Apply three times per week      . hydrochlorothiazide (HYDRODIURIL) 25 MG tablet Take 25 mg by mouth daily. For blood pressure      . Magnesium 400 MG CAPS Take by mouth. Take one daily for leg cramps      . metroNIDAZOLE (METROGEL) 0.75 % gel Apply topically 2 (two) times daily.      . Multiple Vitamins-Minerals ([employer] SPECTRAVITE PO) Take by mouth. One daily for eyes      . naproxen sodium (ALEVE) 220 MG tablet Take 220 mg by mouth. Take two tablets daily as needed for pain      . raloxifene (EVISTA) 60 MG tablet Take 60 mg by mouth daily. To treat osteoporosis      . sodium chloride (MURO 128) 5 % ophthalmic solution 1 drop as needed. One drop in right eye three times daily      . vitamin C (ASCORBIC ACID) 500 MG tablet Take 500 mg by mouth daily.       No  current facility-administered medications on file prior to visit.    Review of Systems  Constitutional: Positive for fever, activity change and appetite change. Negative for chills, diaphoresis, fatigue and unexpected weight change.  HENT: Positive for congestion, ear pain, hearing loss and sore throat (improved). Negative for mouth sores, rhinorrhea and sinus pressure.   Eyes: Positive for visual disturbance (Rx lenses).  Respiratory: Positive for cough. Negative for chest tightness, wheezing and stridor.   Cardiovascular: Negative for chest pain, palpitations and leg swelling.  Gastrointestinal: Negative for nausea, abdominal pain, diarrhea, constipation and abdominal distention.  Endocrine: Negative.  Negative for cold intolerance, heat intolerance and polyuria.  Genitourinary: Negative.   Musculoskeletal: Negative for arthralgias, back pain, gait problem, joint swelling, myalgias, neck pain and neck stiffness.       Tender at the coccyx. No deformity or bruising.  Skin: Negative.        One inch skin tear of the left forearm.  Allergic/Immunologic: Negative.   Neurological: Negative.  Negative for dizziness, tremors, seizures, syncope, facial asymmetry, speech difficulty, weakness, light-headedness, numbness and headaches.  Hematological: Negative.   Psychiatric/Behavioral: Negative.        Objective:BP 164/90  Pulse 73  Temp(Src) 98 F (36.7 C) (Oral)  Resp 14  Wt 138 lb 3.2 oz (62.687 kg)  SpO2 98%    Physical Exam  Constitutional: She is oriented to person, place, and time. She appears well-developed and well-nourished. No distress.  HENT:  Head: Normocephalic and atraumatic.  Right Ear: External ear normal.  Left Ear: External ear normal.  Nose: Nose normal.  Mouth/Throat: Oropharynx is clear and moist.  Eyes: Conjunctivae and EOM are normal. Pupils are equal, round, and reactive to light.  Corrective lenses  Neck: Neck supple. No JVD present. No tracheal deviation  present. No thyromegaly present.  Cardiovascular: Normal rate, regular rhythm, normal heart sounds and intact distal pulses.  Exam reveals no gallop and no friction rub.   No murmur heard. Pulmonary/Chest: No respiratory distress. She has no wheezes. She has no rales. She exhibits no tenderness.  Abdominal: She exhibits no distension and no mass. There is no tenderness.  Musculoskeletal: Normal range of motion. She exhibits no edema and no tenderness.  Tender at coccyx.  Lymphadenopathy:    She has no cervical adenopathy.  Neurological: She is alert and oriented to person, place, and time. She has normal reflexes. No cranial nerve deficit. Coordination normal.  Skin: No rash noted. No erythema. No pallor.  Psychiatric: She has a normal mood and affect. Her behavior is normal. Judgment and thought content normal.          Assessment & Plan:  Viral syndrome: discussed symptomatic treatment with pt.

## 2014-03-27 DIAGNOSIS — E785 Hyperlipidemia, unspecified: Secondary | ICD-10-CM | POA: Diagnosis not present

## 2014-03-27 DIAGNOSIS — I10 Essential (primary) hypertension: Secondary | ICD-10-CM | POA: Diagnosis not present

## 2014-03-27 LAB — BASIC METABOLIC PANEL
BUN: 10 mg/dL (ref 4–21)
Creatinine: 0.6 mg/dL (ref 0.5–1.1)
GLUCOSE: 83 mg/dL
POTASSIUM: 3.6 mmol/L (ref 3.4–5.3)
SODIUM: 136 mmol/L — AB (ref 137–147)

## 2014-03-27 LAB — HEPATIC FUNCTION PANEL
ALT: 17 U/L (ref 7–35)
AST: 30 U/L (ref 13–35)
Alkaline Phosphatase: 43 U/L (ref 25–125)
Bilirubin, Total: 0.6 mg/dL

## 2014-03-27 LAB — LIPID PANEL
CHOLESTEROL: 177 mg/dL (ref 0–200)
HDL: 63 mg/dL (ref 35–70)
LDL Cholesterol: 103 mg/dL
Triglycerides: 54 mg/dL (ref 40–160)

## 2014-04-04 ENCOUNTER — Encounter: Payer: Self-pay | Admitting: Internal Medicine

## 2014-04-04 ENCOUNTER — Non-Acute Institutional Stay: Payer: Medicare Other | Admitting: Internal Medicine

## 2014-04-04 VITALS — BP 148/88 | HR 68 | Ht 65.0 in | Wt 142.0 lb

## 2014-04-04 DIAGNOSIS — E039 Hypothyroidism, unspecified: Secondary | ICD-10-CM

## 2014-04-04 DIAGNOSIS — R209 Unspecified disturbances of skin sensation: Secondary | ICD-10-CM

## 2014-04-04 DIAGNOSIS — I10 Essential (primary) hypertension: Secondary | ICD-10-CM

## 2014-04-04 DIAGNOSIS — R413 Other amnesia: Secondary | ICD-10-CM

## 2014-04-04 DIAGNOSIS — M542 Cervicalgia: Secondary | ICD-10-CM

## 2014-04-04 DIAGNOSIS — E785 Hyperlipidemia, unspecified: Secondary | ICD-10-CM | POA: Diagnosis not present

## 2014-04-04 DIAGNOSIS — M48061 Spinal stenosis, lumbar region without neurogenic claudication: Secondary | ICD-10-CM

## 2014-04-04 DIAGNOSIS — R609 Edema, unspecified: Secondary | ICD-10-CM

## 2014-04-04 DIAGNOSIS — R202 Paresthesia of skin: Secondary | ICD-10-CM

## 2014-04-04 NOTE — Progress Notes (Signed)
Patient ID: Gina Bolton, female   DOB: 10-24-1920, 80 y.o.   MRN: [id number]    Location:    [facility]  Place of Service:  CLINIC  PCP: Estill Dooms, MD  Code Status: LIVING WILL  Extended Emergency Contact Information Primary Emergency Contact: Whitney Muse Address: [place],  [place] Home Phone: [phone] Relation: None  Allergies  Allergen Reactions  . Ibuprofen   . Sulfa Antibiotics     Chief Complaint  Patient presents with  . Medical Managment of Chronic Issues    Comprehensive exam: blood pressure, thyroid, cholesterol  . multiple questions:    Right hand and wrist weak;  Still has some numbness in lower legs and feet, veins seem worse. Had UTI in Dec, always get one in summer, could she have Rx to take to [facility] with her. Questions about her scoliosis.    HPI:  Unspecified essential hypertension: mild SB elevation. No  Headache.   Unspecified hypothyroidism: controlled  Cervicalgia; improved. Hx fx CS  Paresthesia of bilateral legs: stable. Numb and tingling sensations in the legs. Had PNCV 09/22/02 that suggested a a neuropathy  Memory disturbance: no progression. Mild.  Edema: improved      Past Medical History  Diagnosis Date  . Dysphonia 01/25/2013  . Mild cognitive impairment, so stated 12/02/2011  . Chronic rhinitis 12/02/2011  . Urgency of urination 08/26/2011  . Edema 10/09/2010  . Spinal stenosis in cervical region 09/10/2010  . Closed fracture of second cervical vertebra without mention of spinal cord injury 09/10/2010  . Urinary frequency 07/30/2010  . Closed fracture of unspecified part of ramus of mandible 06/11/2010  . Cervicalgia 11/20/2009  . Shortness of breath 11/20/2009  . Spinal stenosis, lumbar region, without neurogenic claudication 08/13/2009  . Unspecified hereditary and idiopathic peripheral neuropathy 05/08/2009  . Rosacea 10/24/2008  . Hemangioma of other sites 9//2008  . Other  sequelae of chronic liver disease 08/31/2007  . Acquired cyst of kidney 9///2008  . Unspecified constipation 02/23/2007  . Neurogenic bladder, NOS 02/23/2007  . Obstructive sleep apnea (adult) (pediatric) 04/21/2006  . Abnormality of gait 11/11/2005  . Disturbance of skin sensation 11/11/2005  . Unspecified essential hypertension 08/02/2002  . Unspecified hypothyroidism 04/19/2001  . Other and unspecified hyperlipidemia 03/09/2001  . Osteoarthrosis, unspecified whether generalized or localized, unspecified site 03/09/2001  . Senile osteoporosis 03/09/2001  . Hereditary spherocytosis 01/14/2000  . Allergic rhinitis, cause unspecified 01/14/2000  . Cramp of limb 01/14/2000  . Personal history of malignant neoplasm of breast 01/14/2000  . Malignant neoplasm of breast (female), unspecified site 05/24/1997    left  . Disorder of bone and cartilage, unspecified 05/04/1995    Past Surgical History  Procedure Laterality Date  . Tonsillectomy    . Abdominal hysterectomy  1974  . Breast lumpectomy Left 1998    Dr. Coletta Memos  . Bladder repair    . Cataract extraction w/ intraocular lens implant  07/1999    Dr. Bing Plume  . Corneal transplant Left 2002  . Colonoscopy  02/28/1999    no polyps small internal hemorrhoid noted    CONSULTANTS Dr. Leory Plowman- Urology  Dr. Lucia Gaskins- ENT  Dr. Coletta Memos-  Surgery  Dr. Alfonso Ramus -Orthopedist  Dr. Mardene Speak -Ophthalmology  Dr. Oletta Lamas -GI  Dr. Sonny Dandy- Oncology Retired  Dr. Argentina Ponder @ [facility]  Dr. Vertell Limber Neurosurgeon  PAST PROCEDURES 02/28/1999- Colonoscopy  No Polyps small internal hemorrhoid noted  03/18/1999- Bone Density  Osteopenia  03/1999- Pap Smear Normal  02/17/2000- Lumbar Spine  12/1999- Urinary post-residual void Normal  03/08/2001- Mammogram  2003- Nerve Study L5S Polyradicular L> R No Lumbar plexopathy-- Polyneuropathy or myopathy  03/14/2001- Sleep Study  03/26/2004- Bone Density  02/24/2006- Sleep Study  04/22/2006- Mammogram   04/24/2006- Sleep Study  03/10/2007- Bone Density  04/30/2007- Mammogram 05/04/2007- Mammogram  05/28/2007 -MRI Abdomen w/out intravenous contrast  05/04/2009 Mammogram Normal  08/13/2009  X-Ray Lumbar Spine Bilateral renal calculi DDD L4-5 Lower lumbar facet arthroplasty  09/11/2009 MRI of the Lumbar Spine: facet arthropathy; hemangioma T12; renal cysts; L3-4, L4-5 canal and lateral recess stenoses; L4-5 disc herniated with left foraminal stenosis. 11/26/2009 Chest X-Ray Hyperinflation with chronic appearing intersitial changes, no convincing acute disease 11/29/2009 Pulmonary function test  05/30/2010 Mammogram Normal  06/11/2010 X-Ray  Left Hip and Pelvis Left pubic fracture. No fracture in left hip  06/21//2011 CT of the Brain Cerebral atrophy No acute intracranial event  09/04/2010  MRI of the Cervical Spine Constellation of history and findings most compatible with an unhealed nondisplaced type 1 odontoid fracture. Superimposed moderate ligamentous hypertrophy at the skull base is probably degenerative Multilevel spondylolisthesis appears chronic, most pronounced CT-T1 and T1-T4  10/28/2010 MRI of the Cervical spine No change from prior study 11/19/2010 CT of the Cervical Spine Suspected healing fracture of the base of the odontoid process(type 2 dens fracture)as correlated with prior MRI's No displaced fractures demonstrated   Social History: History   Social History  . Marital Status: Married    Spouse Name: N/A    Number of Children: N/A  . Years of Education: N/A   Occupational History  . retired Corporate treasurer    Social History Main Topics  . Smoking status: Never Smoker   . Smokeless tobacco: Never Used  . Alcohol Use: 0.6 oz/week    1 Glasses of wine per week     Comment: nightly   . Drug Use: No  . Sexual Activity: None   Other Topics Concern  . None   Social History Narrative   Lives at [facility]   Has a living will   Married [name]   Never smoked    Caffeine beverages tea   Alcohol 1 glasse of wine at dinner   Exercise predominantly walking, floor exercise for back    Family History Family Status  Relation Status Death Age  . Mother Deceased 87    unknown  . Father Deceased 33  . Sister Alive   . Brother Deceased     motor vehicle accident  . Daughter Deceased 14    unknown  . Son Alive   . Sister Deceased 18  . Daughter Alive   . Daughter Alive   . Daughter Alive   . Daughter Alive    Family History  Problem Relation Age of Onset  . Cancer Father     lung  . Cancer Sister     breast      Medications: Patient's Medications  New Prescriptions   No medications on file  Previous Medications   CALCIUM CARB-CHOLECALCIFEROL (CALCIUM + D3) 600-200 MG-UNIT TABS    Take by mouth. Take one tablet twice daily   CHOLECALCIFEROL (VITAMIN D-400 PO)    Take by mouth. Take one daily for vitamin D supplement   CYCLOSPORINE (RESTASIS) 0.05 % OPHTHALMIC EMULSION    1 drop every 12 (twelve) hours. One drop into left eye twice daily   DESONIDE (DESOWEN) 0.05 %  CREAM    Apply topically 2 (two) times daily. As needed   ESTRADIOL (ESTRACE) 0.1 MG/GM VAGINAL CREAM    Place vaginally. Apply three times per week   HYDROCHLOROTHIAZIDE (HYDRODIURIL) 25 MG TABLET    Take 25 mg by mouth daily. For blood pressure   MAGNESIUM 400 MG CAPS    Take by mouth. Take one daily for leg cramps   METRONIDAZOLE (METROGEL) 0.75 % GEL    Apply topically 2 (two) times daily.   MULTIPLE VITAMINS-MINERALS ([employer] SPECTRAVITE PO)    Take by mouth. One daily for eyes   NAPROXEN SODIUM (ALEVE) 220 MG TABLET    Take 220 mg by mouth. Take two tablets daily as needed for pain   RALOXIFENE (EVISTA) 60 MG TABLET    Take 60 mg by mouth daily. To treat osteoporosis   SODIUM CHLORIDE (MURO 128) 5 % OPHTHALMIC SOLUTION    1 drop as needed. One drop in right eye three times daily   VITAMIN C (ASCORBIC ACID) 500 MG TABLET    Take 500 mg by mouth daily.  Modified Medications     No medications on file  Discontinued Medications   LEVOFLOXACIN (LEVAQUIN) 500 MG TABLET    One daily for infection    Immunization History  Administered Date(s) Administered  . Influenza Whole 10/13/2012  . Influenza-Unspecified 09/20/2013  . Pneumococcal Polysaccharide-23 12/22/1996  . Td 12/22/1998     Review of Systems  Constitutional: Negative for fever, chills, diaphoresis, activity change, appetite change, fatigue and unexpected weight change.  HENT: Positive for hearing loss. Negative for congestion, ear pain, mouth sores, rhinorrhea, sinus pressure and sore throat.   Eyes: Positive for visual disturbance (Rx lenses).  Respiratory: Negative for cough, chest tightness, wheezing and stridor.   Cardiovascular: Negative for chest pain, palpitations and leg swelling.  Gastrointestinal: Negative for nausea, abdominal pain, diarrhea, constipation and abdominal distention.  Endocrine: Negative.  Negative for cold intolerance, heat intolerance and polyuria.  Genitourinary:       Chronic leakage. Has had urologic evaluation.  Musculoskeletal: Positive for back pain and neck pain. Negative for arthralgias, gait problem, joint swelling, myalgias and neck stiffness.       Tender at the coccyx. No deformity or bruising.  Skin: Negative.        One inch skin tear of the left forearm.  Allergic/Immunologic: Negative.   Neurological: Negative for dizziness, tremors, seizures, syncope, facial asymmetry, speech difficulty, weakness, light-headedness, numbness and headaches.       Some balance issues. No falls.  Hematological: Negative.   Psychiatric/Behavioral: Negative.       Filed Vitals:   04/04/14 1036  BP: 148/88  Pulse: 68  Height: 5\' 5"  (1.651 m)  Weight: 142 lb (64.411 kg)   Physical Exam  Constitutional: She is oriented to person, place, and time. She appears well-developed and well-nourished. No distress.  HENT:  Right Ear: External ear normal.  Left Ear: External  ear normal.  Nose: Nose normal.  Mouth/Throat: Oropharynx is clear and moist. No oropharyngeal exudate.  Significant hearing deficit bilaterally.  Eyes: Conjunctivae and EOM are normal. Pupils are equal, round, and reactive to light.  Corrective lenses  Neck: No JVD present. No tracheal deviation present. No thyromegaly present.  Cardiovascular: Normal rate, regular rhythm, normal heart sounds and intact distal pulses.  Exam reveals no gallop and no friction rub.   No murmur heard. Respiratory: No respiratory distress. She has no wheezes. She has no rales. She exhibits no tenderness.  GI: She exhibits no distension and no mass. There is no tenderness. There is no rebound.  Genitourinary:  atrophy  Musculoskeletal: Normal range of motion. She exhibits no edema and no tenderness.  Typical Heberden's nodes and joint deformities of OA in both hands.  Lymphadenopathy:    She has no cervical adenopathy.  Neurological: She is alert and oriented to person, place, and time. She displays abnormal reflex (absent patellar relex bilaterally). No cranial nerve deficit. She exhibits normal muscle tone. Coordination normal.  Absent vibratory sensation in right foot and diminished in left foot  Skin: Skin is warm and dry. No rash noted. No erythema. No pallor.  Psychiatric: She has a normal mood and affect. Her behavior is normal. Judgment and thought content normal.       Labs reviewed: Nursing Home on 04/04/2014  Component Date Value Ref Range Status  . Glucose 03/27/2014 83   Final  . BUN 03/27/2014 10  4 - 21 mg/dL Final  . Creatinine 03/27/2014 0.6  0.5 - 1.1 mg/dL Final  . Potassium 03/27/2014 3.6  3.4 - 5.3 mmol/L Final  . Sodium 03/27/2014 136* 137 - 147 mmol/L Final  . Triglycerides 03/27/2014 54  40 - 160 mg/dL Final  . Cholesterol 03/27/2014 177  0 - 200 mg/dL Final  . HDL 03/27/2014 63  35 - 70 mg/dL Final  . LDL Cholesterol 03/27/2014 103   Final  . Alkaline Phosphatase 03/27/2014  43  25 - 125 U/L Final  . ALT 03/27/2014 17  7 - 35 U/L Final  . AST 03/27/2014 30  13 - 35 U/L Final  . Bilirubin, Total 03/27/2014 0.6   Final     Assessment/Plan 1. Unspecified essential hypertension Continue current medication  2. Unspecified hypothyroidism compensated  3. Cervicalgia Improved.related to prior CS fx  4. Paresthesia of bilateral legs Stable. Possibly related to known spinal stenosis.  5. Memory disturbance No progression  6. Edema improved

## 2014-04-05 NOTE — Patient Instructions (Signed)
Continue current medications. 

## 2014-04-11 ENCOUNTER — Other Ambulatory Visit: Payer: Self-pay

## 2014-04-11 MED ORDER — ESTRADIOL 0.1 MG/GM VA CREA: TOPICAL_CREAM | VAGINAL | Status: DC

## 2014-04-13 DIAGNOSIS — N3020 Other chronic cystitis without hematuria: Secondary | ICD-10-CM | POA: Diagnosis not present

## 2014-04-13 DIAGNOSIS — R3915 Urgency of urination: Secondary | ICD-10-CM | POA: Diagnosis not present

## 2014-04-17 ENCOUNTER — Encounter: Payer: Self-pay | Admitting: *Deleted

## 2014-04-19 ENCOUNTER — Encounter: Payer: Self-pay | Admitting: Internal Medicine

## 2014-05-31 ENCOUNTER — Other Ambulatory Visit: Payer: Self-pay | Admitting: Internal Medicine

## 2014-05-31 DIAGNOSIS — H532 Diplopia: Secondary | ICD-10-CM | POA: Diagnosis not present

## 2014-05-31 DIAGNOSIS — H5015 Alternating exotropia: Secondary | ICD-10-CM | POA: Diagnosis not present

## 2014-05-31 DIAGNOSIS — H353190 Nonexudative age-related macular degeneration, unspecified eye, stage unspecified: Secondary | ICD-10-CM | POA: Diagnosis not present

## 2014-05-31 DIAGNOSIS — H52219 Irregular astigmatism, unspecified eye: Secondary | ICD-10-CM | POA: Diagnosis not present

## 2014-05-31 DIAGNOSIS — H18519 Endothelial corneal dystrophy, unspecified eye: Secondary | ICD-10-CM | POA: Diagnosis not present

## 2014-06-01 ENCOUNTER — Other Ambulatory Visit: Payer: Self-pay | Admitting: *Deleted

## 2014-06-01 MED ORDER — HYDROCHLOROTHIAZIDE 25 MG PO TABS: 25.0000 mg | ORAL_TABLET | Freq: Every day | ORAL | Status: DC

## 2014-09-05 ENCOUNTER — Non-Acute Institutional Stay: Payer: Medicare Other | Admitting: Internal Medicine

## 2014-09-05 ENCOUNTER — Encounter: Payer: Self-pay | Admitting: Internal Medicine

## 2014-09-05 VITALS — BP 192/90 | HR 72 | Temp 97.9°F | Wt 135.0 lb

## 2014-09-05 DIAGNOSIS — G43109 Migraine with aura, not intractable, without status migrainosus: Secondary | ICD-10-CM

## 2014-09-05 DIAGNOSIS — I10 Essential (primary) hypertension: Secondary | ICD-10-CM

## 2014-09-05 DIAGNOSIS — G43809 Other migraine, not intractable, without status migrainosus: Secondary | ICD-10-CM | POA: Diagnosis not present

## 2014-09-05 DIAGNOSIS — H35039 Hypertensive retinopathy, unspecified eye: Secondary | ICD-10-CM | POA: Diagnosis not present

## 2014-09-05 DIAGNOSIS — R3915 Urgency of urination: Secondary | ICD-10-CM

## 2014-09-05 MED ORDER — METOPROLOL TARTRATE 50 MG PO TABS: ORAL_TABLET | ORAL | Status: DC

## 2014-09-05 NOTE — Progress Notes (Signed)
Patient ID: RIA REDCAY, female   DOB: [birth year], 80 y.o.   MRN: [id number]    Location:  [facility]   Place of Service: Clinic (12)    Allergies  Allergen Reactions  . Ibuprofen   . Sulfa Antibiotics     Chief Complaint  Patient presents with  . Acute Visit    seen at Dr. Ellwood Handler office this morning, for flashes in left eye. Dx occular migraine, BP 200/100 right arm; pulse 72    HPI:  Seen at [facility], Dr. Mardene Speak with Dr. Bing Plume, today for flashing light in the right eye. Diagnosed with ocular migraine. No pain or headache. Incidental finding of BP 200/110 and P 72 with irregularity.  Patient very anxious about her eye. Also dealing with stressful situation of her husband losing memory and her life changing because of this.  Has bladder urgency and mild dysuria. Thought she had UTI earlier in the summer. Took Macrobid and symptoms resolved. No culture was done.  Has felt a little confused today, but she is able to relate events coherently.   Medications: Patient's Medications  New Prescriptions   METOPROLOL (LOPRESSOR) 50 MG TABLET    One daily to control BP  Previous Medications   CALCIUM CARB-CHOLECALCIFEROL (CALCIUM + D3) 600-200 MG-UNIT TABS    Take by mouth. Take one tablet twice daily   CHOLECALCIFEROL (VITAMIN D-400 PO)    Take by mouth. Take one daily for vitamin D supplement   CYCLOSPORINE (RESTASIS) 0.05 % OPHTHALMIC EMULSION    1 drop every 12 (twelve) hours. One drop into left eye twice daily   DESONIDE (DESOWEN) 0.05 % CREAM    Apply topically 2 (two) times daily. As needed   ESTRADIOL (ESTRACE) 0.1 MG/GM VAGINAL CREAM    Apply three times per week   HYDROCHLOROTHIAZIDE (HYDRODIURIL) 25 MG TABLET    Take 1 tablet (25 mg total) by mouth daily. For blood pressure   MAGNESIUM 400 MG CAPS    Take by mouth. Take one daily for leg cramps   METRONIDAZOLE (METROGEL) 0.75 % GEL    Apply topically 2 (two) times daily.   MULTIPLE VITAMINS-MINERALS ([employer]  SPECTRAVITE PO)    Take by mouth. One daily for eyes   NAPROXEN SODIUM (ALEVE) 220 MG TABLET    Take 220 mg by mouth. Take two tablets daily as needed for pain   RALOXIFENE (EVISTA) 60 MG TABLET    Take 60 mg by mouth daily. To treat osteoporosis   SODIUM CHLORIDE (MURO 128) 5 % OPHTHALMIC SOLUTION    1 drop as needed. One drop in right eye three times daily   VITAMIN C (ASCORBIC ACID) 500 MG TABLET    Take 500 mg by mouth daily.  Modified Medications   No medications on file  Discontinued Medications   No medications on file     Review of Systems  Constitutional: Negative for fever, chills, diaphoresis, activity change, appetite change, fatigue and unexpected weight change.  HENT: Positive for hearing loss. Negative for congestion, ear pain, mouth sores, rhinorrhea, sinus pressure and sore throat.   Eyes: Positive for visual disturbance (Rx lenses).       Ocular migraine right eye.  Respiratory: Negative for cough, chest tightness, wheezing and stridor.   Cardiovascular: Negative for chest pain, palpitations and leg swelling.  Gastrointestinal: Negative for nausea, abdominal pain, diarrhea, constipation and abdominal distention.  Endocrine: Negative.  Negative for cold intolerance, heat intolerance and polyuria.  Genitourinary:  Chronic leakage. Has had urologic evaluation.  Musculoskeletal: Positive for back pain and neck pain. Negative for arthralgias, gait problem, joint swelling, myalgias and neck stiffness.  Skin: Negative.   Allergic/Immunologic: Negative.   Neurological: Negative for dizziness, tremors, seizures, syncope, facial asymmetry, speech difficulty, weakness, light-headedness, numbness and headaches.       Some balance issues. No falls.  Hematological: Negative.   Psychiatric/Behavioral: Negative.     Filed Vitals:   09/05/14 1221  BP: 192/90  Pulse: 72  Temp: 97.9 F (36.6 C)  TempSrc: Oral  Weight: 135 lb (61.236 kg)  SpO2: 92%   Body mass index is  22.47 kg/(m^2).  Physical Exam  Constitutional: She is oriented to person, place, and time. She appears well-developed and well-nourished. No distress.  HENT:  Head: Normocephalic and atraumatic.  Right Ear: External ear normal.  Left Ear: External ear normal.  Nose: Nose normal.  Mouth/Throat: Oropharynx is clear and moist.  Eyes: Conjunctivae and EOM are normal. Pupils are equal, round, and reactive to light.  Corrective lenses. Large pupils bilateraly due to dilation at ophth.  Neck: Neck supple. No JVD present. No tracheal deviation present. No thyromegaly present.  Cardiovascular: Normal rate, regular rhythm, normal heart sounds and intact distal pulses.  Exam reveals no gallop and no friction rub.   No murmur heard. Pulmonary/Chest: No respiratory distress. She has no wheezes. She has no rales. She exhibits no tenderness.  Abdominal: She exhibits no distension and no mass. There is no tenderness.  Musculoskeletal: Normal range of motion. She exhibits no edema and no tenderness.  Tender at coccyx.  Lymphadenopathy:    She has no cervical adenopathy.  Neurological: She is alert and oriented to person, place, and time. She has normal reflexes. No cranial nerve deficit. Coordination normal.  Skin: No rash noted. No erythema. No pallor.  Psychiatric: She has a normal mood and affect. Her behavior is normal. Judgment and thought content normal.     Labs reviewed: No visits with results within 3 Month(s) from this visit. Latest known visit with results is:  Nursing Home on 04/04/2014  Component Date Value Ref Range Status  . Glucose 03/27/2014 83   Final  . BUN 03/27/2014 10  4 - 21 mg/dL Final  . Creatinine 03/27/2014 0.6  0.5 - 1.1 mg/dL Final  . Potassium 03/27/2014 3.6  3.4 - 5.3 mmol/L Final  . Sodium 03/27/2014 136* 137 - 147 mmol/L Final  . Triglycerides 03/27/2014 54  40 - 160 mg/dL Final  . Cholesterol 03/27/2014 177  0 - 200 mg/dL Final  . HDL 03/27/2014 63  35 - 70  mg/dL Final  . LDL Cholesterol 03/27/2014 103   Final  . Alkaline Phosphatase 03/27/2014 43  25 - 125 U/L Final  . ALT 03/27/2014 17  7 - 35 U/L Final  . AST 03/27/2014 30  13 - 35 U/L Final  . Bilirubin, Total 03/27/2014 0.6   Final    Assessment/Plan  1. Unspecified essential hypertension - CMP, UA and culture - metoprolol (LOPRESSOR) 50 MG tablet; One daily to control BP  Dispense: 30 tablet; Refill: 3  2. Ocular migraine Metoprolol may help  3. Urgency of urination - UA and culture

## 2014-09-07 ENCOUNTER — Telehealth: Payer: Self-pay | Admitting: *Deleted

## 2014-09-07 DIAGNOSIS — I10 Essential (primary) hypertension: Secondary | ICD-10-CM | POA: Diagnosis not present

## 2014-09-07 DIAGNOSIS — G43809 Other migraine, not intractable, without status migrainosus: Secondary | ICD-10-CM | POA: Diagnosis not present

## 2014-09-07 DIAGNOSIS — R3915 Urgency of urination: Secondary | ICD-10-CM | POA: Diagnosis not present

## 2014-09-07 LAB — BASIC METABOLIC PANEL
BUN: 12 mg/dL (ref 4–21)
CREATININE: 0.7 mg/dL (ref 0.5–1.1)
Glucose: 94 mg/dL
Potassium: 3.7 mmol/L (ref 3.4–5.3)
Sodium: 134 mmol/L — AB (ref 137–147)

## 2014-09-07 LAB — HEPATIC FUNCTION PANEL
ALK PHOS: 42 U/L (ref 25–125)
ALT: 24 U/L (ref 7–35)
AST: 29 U/L (ref 13–35)
Bilirubin, Total: 0.6 mg/dL

## 2014-09-07 MED ORDER — AMLODIPINE BESYLATE 5 MG PO TABS: ORAL_TABLET | ORAL | Status: DC

## 2014-09-07 NOTE — Telephone Encounter (Signed)
Add amlodipine 5 mg daily. Send in Rx for 30 tabs with 5 refills

## 2014-09-07 NOTE — Telephone Encounter (Signed)
Patient Notified and agreed. Rx faxed to [facility].

## 2014-09-07 NOTE — Telephone Encounter (Signed)
Patient called and stated that her BP is running high at 190/140. Yesterday woke up with sparkles in her eyes and she went to the eye doctor and they took her BP and it was 190/110. Dr. Jerline Pain called Dr. Nyoka Cowden and you put her on Metoprolol when you saw her yesterday but she doesn't feel it is working and wants you to address, it  came down to 138/82 yesterday and today it is back up to 190/140. Going out of town tomorrow and nervous about going due to BP. Please Advise.

## 2014-09-19 ENCOUNTER — Encounter: Payer: Self-pay | Admitting: Internal Medicine

## 2014-09-19 ENCOUNTER — Non-Acute Institutional Stay: Payer: Medicare Other | Admitting: Internal Medicine

## 2014-09-19 VITALS — BP 166/86 | HR 60 | Wt 136.0 lb

## 2014-09-19 DIAGNOSIS — I10 Essential (primary) hypertension: Secondary | ICD-10-CM | POA: Diagnosis not present

## 2014-09-25 NOTE — Progress Notes (Signed)
Patient ID: Gina Bolton, female   DOB: [birth year], 80 y.o.   MRN: [id number]    [facility]     Place of Service: Clinic (12)      Allergies  Allergen Reactions  . Ibuprofen   . Sulfa Antibiotics     Chief Complaint  Patient presents with  . Medical Management of Chronic Issues    blood pressure, urine urgency. Started the Lopressor, but did not start the Norvasc, afraid it would lower BP too much.    HPI:  Continues with elevated BP. She started the Lopressor, but did not start the Norvasc. She wa concerned it could lower her BP too much. She has a dull headache. She denies palpitations, chest pain, or dyspnea.  Medications: Patient's Medications  New Prescriptions   No medications on file  Previous Medications   AMLODIPINE (NORVASC) 5 MG TABLET    Take one tablet by mouth once daily to control blood pressure   CALCIUM CARB-CHOLECALCIFEROL (CALCIUM + D3) 600-200 MG-UNIT TABS    Take by mouth. Take one tablet twice daily   CHOLECALCIFEROL (VITAMIN D-400 PO)    Take by mouth. Take one daily for vitamin D supplement   CYCLOSPORINE (RESTASIS) 0.05 % OPHTHALMIC EMULSION    1 drop every 12 (twelve) hours. One drop into left eye twice daily   DESONIDE (DESOWEN) 0.05 % CREAM    Apply topically 2 (two) times daily. As needed   ESTRADIOL (ESTRACE) 0.1 MG/GM VAGINAL CREAM    Apply three times per week   HYDROCHLOROTHIAZIDE (HYDRODIURIL) 25 MG TABLET    Take 1 tablet (25 mg total) by mouth daily. For blood pressure   MAGNESIUM 400 MG CAPS    Take by mouth. Take one daily for leg cramps   METOPROLOL (LOPRESSOR) 50 MG TABLET    One daily to control BP   METRONIDAZOLE (METROGEL) 0.75 % GEL    Apply topically 2 (two) times daily.   MULTIPLE VITAMINS-MINERALS ([employer] SPECTRAVITE PO)    Take by mouth. One daily for eyes   NAPROXEN SODIUM (ALEVE) 220 MG TABLET    Take 220 mg by mouth. Take two tablets daily as needed for pain   RALOXIFENE (EVISTA) 60 MG TABLET    Take 60 mg  by mouth daily. To treat osteoporosis   SODIUM CHLORIDE (MURO 128) 5 % OPHTHALMIC SOLUTION    1 drop as needed. One drop in right eye three times daily   VITAMIN C (ASCORBIC ACID) 500 MG TABLET    Take 500 mg by mouth daily.  Modified Medications   No medications on file  Discontinued Medications   No medications on file     Review of Systems  Constitutional: Negative for fever, chills, diaphoresis, activity change, appetite change, fatigue and unexpected weight change.  HENT: Positive for hearing loss. Negative for congestion, ear pain, mouth sores, rhinorrhea, sinus pressure and sore throat.   Eyes: Positive for visual disturbance (Rx lenses).       Ocular migraine right eye.  Respiratory: Negative for cough, chest tightness, wheezing and stridor.   Cardiovascular: Negative for chest pain, palpitations and leg swelling.  Gastrointestinal: Negative for nausea, abdominal pain, diarrhea, constipation and abdominal distention.  Endocrine: Negative.  Negative for cold intolerance, heat intolerance and polyuria.  Genitourinary:       Chronic leakage. Has had urologic evaluation.  Musculoskeletal: Positive for back pain and neck pain. Negative for arthralgias, gait problem, joint swelling, myalgias and neck stiffness.  Skin: Negative.  Allergic/Immunologic: Negative.   Neurological: Negative for dizziness, tremors, seizures, syncope, facial asymmetry, speech difficulty, weakness, light-headedness, numbness and headaches.       Some balance issues. No falls.  Hematological: Negative.   Psychiatric/Behavioral: Negative.     Filed Vitals:   09/19/14 1125  BP: 166/86  Pulse: 60  Weight: 136 lb (61.689 kg)   Body mass index is 22.63 kg/(m^2).  Physical Exam  Constitutional: She is oriented to person, place, and time. She appears well-developed and well-nourished. No distress.  HENT:  Head: Normocephalic and atraumatic.  Right Ear: External ear normal.  Left Ear: External ear normal.   Nose: Nose normal.  Mouth/Throat: Oropharynx is clear and moist.  Eyes: Conjunctivae and EOM are normal. Pupils are equal, round, and reactive to light.  Corrective lenses. Large pupils bilateraly due to dilation at ophth.  Neck: Neck supple. No JVD present. No tracheal deviation present. No thyromegaly present.  Cardiovascular: Normal rate, regular rhythm, normal heart sounds and intact distal pulses.  Exam reveals no gallop and no friction rub.   No murmur heard. Pulmonary/Chest: No respiratory distress. She has no wheezes. She has no rales. She exhibits no tenderness.  Abdominal: She exhibits no distension and no mass. There is no tenderness.  Musculoskeletal: Normal range of motion. She exhibits no edema and no tenderness.  Tender at coccyx.  Lymphadenopathy:    She has no cervical adenopathy.  Neurological: She is alert and oriented to person, place, and time. She has normal reflexes. No cranial nerve deficit. Coordination normal.  Skin: No rash noted. No erythema. No pallor.  Psychiatric: She has a normal mood and affect. Her behavior is normal. Judgment and thought content normal.     Labs reviewed: Nursing Home on 09/19/2014  Component Date Value Ref Range Status  . Glucose 09/07/2014 94   Final  . BUN 09/07/2014 12  4 - 21 mg/dL Final  . Creatinine 09/07/2014 0.7  0.5 - 1.1 mg/dL Final  . Potassium 09/07/2014 3.7  3.4 - 5.3 mmol/L Final  . Sodium 09/07/2014 134* 137 - 147 mmol/L Final  . Alkaline Phosphatase 09/07/2014 42  25 - 125 U/L Final  . ALT 09/07/2014 24  7 - 35 U/L Final  . AST 09/07/2014 29  13 - 35 U/L Final  . Bilirubin, Total 09/07/2014 0.6   Final     Assessment/Plan  1. Essential hypertension -use amlodipine 5 mg qd as ordered in addition to other routine medications.

## 2014-10-03 ENCOUNTER — Non-Acute Institutional Stay: Payer: Medicare Other | Admitting: Internal Medicine

## 2014-10-03 ENCOUNTER — Encounter: Payer: Self-pay | Admitting: Internal Medicine

## 2014-10-03 VITALS — BP 132/78 | HR 60 | Temp 97.8°F | Wt 135.0 lb

## 2014-10-03 DIAGNOSIS — I10 Essential (primary) hypertension: Secondary | ICD-10-CM | POA: Diagnosis not present

## 2014-10-03 DIAGNOSIS — R609 Edema, unspecified: Secondary | ICD-10-CM

## 2014-10-03 NOTE — Progress Notes (Signed)
Patient ID: Gina Bolton, female   DOB: 1920-06-11, 80 y.o.   MRN: [id number]    [facility]     Place of Service: Clinic (12)    Allergies  Allergen Reactions  . Ibuprofen   . Sulfa Antibiotics     Chief Complaint  Patient presents with  . Medical Management of Chronic Issues    blood pressure    HPI:  Essential hypertension: now on 3 drug therapy. Tolerating medications except for some mild edema.  Edema: mostly secondary to the amlodipine    Medications: Patient's Medications  New Prescriptions   No medications on file  Previous Medications   CALCIUM CARB-CHOLECALCIFEROL (CALCIUM + D3) 600-200 MG-UNIT TABS    Take by mouth. Take one tablet twice daily   CHOLECALCIFEROL (VITAMIN D-400 PO)    Take by mouth. Take one daily for vitamin D supplement   CYCLOSPORINE (RESTASIS) 0.05 % OPHTHALMIC EMULSION    1 drop every 12 (twelve) hours. One drop into left eye twice daily   DESONIDE (DESOWEN) 0.05 % CREAM    Apply topically 2 (two) times daily. As needed   ESTRADIOL (ESTRACE) 0.1 MG/GM VAGINAL CREAM    Apply three times per week   HYDROCHLOROTHIAZIDE (HYDRODIURIL) 25 MG TABLET    Take 1 tablet (25 mg total) by mouth daily. For blood pressure   MAGNESIUM 400 MG CAPS    Take by mouth. Take one daily for leg cramps   METRONIDAZOLE (METROGEL) 0.75 % GEL    Apply topically 2 (two) times daily.   MULTIPLE VITAMINS-MINERALS ([employer] SPECTRAVITE PO)    Take by mouth. One daily for eyes   NAPROXEN SODIUM (ALEVE) 220 MG TABLET    Take 220 mg by mouth. Take two tablets daily as needed for pain   RALOXIFENE (EVISTA) 60 MG TABLET    Take 60 mg by mouth daily. To treat osteoporosis   SODIUM CHLORIDE (MURO 128) 5 % OPHTHALMIC SOLUTION    1 drop as needed. One drop in right eye three times daily   VITAMIN C (ASCORBIC ACID) 500 MG TABLET    Take 500 mg by mouth daily.  Modified Medications   Modified Medication Previous Medication   AMLODIPINE (NORVASC) 5 MG TABLET amLODipine  (NORVASC) 5 MG tablet      Take one tablet by mouth once daily to control blood pressure at bedtme    Take one tablet by mouth once daily to control blood pressure   METOPROLOL (LOPRESSOR) 50 MG TABLET metoprolol (LOPRESSOR) 50 MG tablet      One daily in morning to control blood pressure    One daily to control BP  Discontinued Medications   No medications on file     Review of Systems  Constitutional: Negative for fever, chills, diaphoresis, activity change, appetite change, fatigue and unexpected weight change.  HENT: Positive for hearing loss. Negative for congestion, ear pain, mouth sores, rhinorrhea, sinus pressure and sore throat.   Eyes: Positive for visual disturbance (Rx lenses).       Ocular migraine right eye.  Respiratory: Negative for cough, chest tightness, wheezing and stridor.   Cardiovascular: Negative for chest pain, palpitations and leg swelling.  Gastrointestinal: Negative for nausea, abdominal pain, diarrhea, constipation and abdominal distention.  Endocrine: Negative.  Negative for cold intolerance, heat intolerance and polyuria.  Genitourinary:       Chronic leakage. Has had urologic evaluation.  Musculoskeletal: Positive for back pain and neck pain. Negative for arthralgias, gait problem, joint  swelling, myalgias and neck stiffness.  Skin: Negative.   Allergic/Immunologic: Negative.   Neurological: Negative for dizziness, tremors, seizures, syncope, facial asymmetry, speech difficulty, weakness, light-headedness, numbness and headaches.       Some balance issues. No falls.  Hematological: Negative.   Psychiatric/Behavioral: Negative.     Filed Vitals:   10/03/14 0943  BP: 132/78  Pulse: 60  Temp: 97.8 F (36.6 C)  TempSrc: Oral  Weight: 135 lb (61.236 kg)   Body mass index is 22.47 kg/(m^2).  Physical Exam  Constitutional: She is oriented to person, place, and time. She appears well-developed and well-nourished. No distress.  HENT:  Head:  Normocephalic and atraumatic.  Right Ear: External ear normal.  Left Ear: External ear normal.  Nose: Nose normal.  Mouth/Throat: Oropharynx is clear and moist.  Eyes: Conjunctivae and EOM are normal. Pupils are equal, round, and reactive to light.  Corrective lenses.  Neck: Neck supple. No JVD present. No tracheal deviation present. No thyromegaly present.  Cardiovascular: Normal rate, regular rhythm, normal heart sounds and intact distal pulses.  Exam reveals no gallop and no friction rub.   No murmur heard. Pulmonary/Chest: No respiratory distress. She has no wheezes. She has no rales. She exhibits no tenderness.  Abdominal: She exhibits no distension and no mass. There is no tenderness.  Musculoskeletal: Normal range of motion. She exhibits no edema and no tenderness.  Tender at coccyx.  Lymphadenopathy:    She has no cervical adenopathy.  Neurological: She is alert and oriented to person, place, and time. She has normal reflexes. No cranial nerve deficit. Coordination normal.  Skin: No rash noted. No erythema. No pallor.  Psychiatric: She has a normal mood and affect. Her behavior is normal. Judgment and thought content normal.     Labs reviewed: Nursing Home on 09/19/2014  Component Date Value Ref Range Status  . Glucose 09/07/2014 94   Final  . BUN 09/07/2014 12  4 - 21 mg/dL Final  . Creatinine 09/07/2014 0.7  0.5 - 1.1 mg/dL Final  . Potassium 09/07/2014 3.7  3.4 - 5.3 mmol/L Final  . Sodium 09/07/2014 134* 137 - 147 mmol/L Final  . Alkaline Phosphatase 09/07/2014 42  25 - 125 U/L Final  . ALT 09/07/2014 24  7 - 35 U/L Final  . AST 09/07/2014 29  13 - 35 U/L Final  . Bilirubin, Total 09/07/2014 0.6   Final     Assessment/Plan  1. Essential hypertension controlled  2. Edema Patient is willing to remain on amlodipine.

## 2014-10-07 DIAGNOSIS — Z23 Encounter for immunization: Secondary | ICD-10-CM | POA: Diagnosis not present

## 2014-10-18 ENCOUNTER — Other Ambulatory Visit: Payer: Self-pay | Admitting: Internal Medicine

## 2014-10-18 NOTE — Telephone Encounter (Signed)
[employer] [facility] 

## 2014-10-31 ENCOUNTER — Other Ambulatory Visit: Payer: Self-pay | Admitting: Internal Medicine

## 2014-10-31 DIAGNOSIS — I10 Essential (primary) hypertension: Secondary | ICD-10-CM

## 2014-10-31 MED ORDER — LOSARTAN POTASSIUM 50 MG PO TABS: ORAL_TABLET | ORAL | Status: DC

## 2014-12-12 ENCOUNTER — Non-Acute Institutional Stay: Payer: Medicare Other | Admitting: Internal Medicine

## 2014-12-12 ENCOUNTER — Encounter: Payer: Self-pay | Admitting: Internal Medicine

## 2014-12-12 VITALS — BP 142/86 | HR 64 | Temp 98.6°F | Wt 138.0 lb

## 2014-12-12 DIAGNOSIS — M7631 Iliotibial band syndrome, right leg: Secondary | ICD-10-CM | POA: Insufficient documentation

## 2014-12-12 NOTE — Progress Notes (Signed)
Patient ID: Gina Bolton, female   DOB: 1920-11-20, 80 y.o.   MRN: [id number]    [facility]    Place of Service: Clinic (12) OFFICE   Allergies  Allergen Reactions  . Ibuprofen   . Sulfa Antibiotics     Chief Complaint  Patient presents with  . Hip Pain    and knee, right. Hasn't fallen, started about 3 weeks ago after vacuuming, gets worse after walking.     HPI:  Patient is having pain that extends from the right greater trochanteric area down to her right knee laterally. She has not fallen. It started about 3 weeks ago and has not improved a lot. Has not gotten any worse. It does not disable her keep her from walking. It seems start the day after she was cleaning with a new vacuum attachment. She was having to press much harder on this. The discomfort seems to get worse after walking. It does not keep her awake at night.  Medications: Patient's Medications  New Prescriptions   No medications on file  Previous Medications   CALCIUM CARB-CHOLECALCIFEROL (CALCIUM + D3) 600-200 MG-UNIT TABS    Take by mouth. Take one tablet twice daily   CHOLECALCIFEROL (VITAMIN D-400 PO)    Take by mouth. Take one daily for vitamin D supplement   CYCLOSPORINE (RESTASIS) 0.05 % OPHTHALMIC EMULSION    1 drop every 12 (twelve) hours. One drop into left eye twice daily   DESONIDE (DESOWEN) 0.05 % CREAM    Apply topically 2 (two) times daily. As needed   ESTRADIOL (ESTRACE) 0.1 MG/GM VAGINAL CREAM    Apply three times per week   HYDROCHLOROTHIAZIDE (HYDRODIURIL) 25 MG TABLET    TAKE 1 TABLET (25 MG TOTAL) BY MOUTH DAILY. FOR BLOOD PRESSURE   LOSARTAN (COZAAR) 50 MG TABLET    One daily to lower BP   MAGNESIUM 400 MG CAPS    Take by mouth. Take one daily for leg cramps   METOPROLOL (LOPRESSOR) 50 MG TABLET    One daily in morning to control blood pressure   METRONIDAZOLE (METROGEL) 0.75 % GEL    Apply topically 2 (two) times daily.   MULTIPLE VITAMINS-MINERALS ([employer] SPECTRAVITE  PO)    Take by mouth. One daily for eyes   NAPROXEN SODIUM (ALEVE) 220 MG TABLET    Take 220 mg by mouth. Take two tablets daily as needed for pain   RALOXIFENE (EVISTA) 60 MG TABLET    Take 60 mg by mouth daily. To treat osteoporosis   SODIUM CHLORIDE (MURO 128) 5 % OPHTHALMIC SOLUTION    1 drop as needed. One drop in right eye three times daily   VITAMIN C (ASCORBIC ACID) 500 MG TABLET    Take 500 mg by mouth daily.  Modified Medications   No medications on file  Discontinued Medications   No medications on file     Review of Systems  Constitutional: Negative for fever, chills, diaphoresis, activity change, appetite change, fatigue and unexpected weight change.  HENT: Positive for hearing loss. Negative for congestion, ear pain, mouth sores, rhinorrhea, sinus pressure and sore throat.   Eyes: Positive for visual disturbance (Rx lenses).       Ocular migraine right eye.  Respiratory: Negative for cough, chest tightness, wheezing and stridor.   Cardiovascular: Negative for chest pain, palpitations and leg swelling.  Gastrointestinal: Negative for nausea, abdominal pain, diarrhea, constipation and abdominal distention.  Endocrine: Negative.  Negative for cold intolerance, heat  intolerance and polyuria.  Genitourinary:       Chronic leakage. Has had urologic evaluation.  Musculoskeletal: Positive for back pain and neck pain. Negative for myalgias, joint swelling, arthralgias, gait problem and neck stiffness.       New pain in the right leg laterally extending from the greater trochanter to the lateral aspect of the right knee. Started about 11/21/14.  Skin: Negative.   Allergic/Immunologic: Negative.   Neurological: Negative for dizziness, tremors, seizures, syncope, facial asymmetry, speech difficulty, weakness, light-headedness, numbness and headaches.       Some balance issues. No falls.  Hematological: Negative.   Psychiatric/Behavioral: Negative.     Filed Vitals:   12/12/14 0948    BP: 142/86  Pulse: 64  Temp: 98.6 F (37 C)  TempSrc: Oral  Weight: 138 lb (62.596 kg)   Body mass index is 22.96 kg/(m^2).  Physical Exam  Constitutional: She is oriented to person, place, and time. She appears well-developed and well-nourished. No distress.  HENT:  Head: Normocephalic and atraumatic.  Right Ear: External ear normal.  Left Ear: External ear normal.  Nose: Nose normal.  Mouth/Throat: Oropharynx is clear and moist.  Eyes: Conjunctivae and EOM are normal. Pupils are equal, round, and reactive to light.  Corrective lenses.  Neck: Neck supple. No JVD present. No tracheal deviation present. No thyromegaly present.  Cardiovascular: Normal rate, regular rhythm, normal heart sounds and intact distal pulses.  Exam reveals no gallop and no friction rub.   No murmur heard. Pulmonary/Chest: No respiratory distress. She has no wheezes. She has no rales. She exhibits no tenderness.  Abdominal: She exhibits no distension and no mass. There is no tenderness.  Musculoskeletal: Normal range of motion. She exhibits no edema or tenderness.  Tender at coccyx. Tender on palpation from the right greater trochanter down the right lateral thigh to the right knee. This area is compatible with the iliotibial ligament.  Lymphadenopathy:    She has no cervical adenopathy.  Neurological: She is alert and oriented to person, place, and time. She has normal reflexes. No cranial nerve deficit. Coordination normal.  Skin: No rash noted. No erythema. No pallor.  Psychiatric: She has a normal mood and affect. Her behavior is normal. Judgment and thought content normal.     Labs reviewed: Nursing Home on 09/19/2014  Component Date Value Ref Range Status  . Glucose 09/07/2014 94   Final  . BUN 09/07/2014 12  4 - 21 mg/dL Final  . Creatinine 09/07/2014 0.7  0.5 - 1.1 mg/dL Final  . Potassium 09/07/2014 3.7  3.4 - 5.3 mmol/L Final  . Sodium 09/07/2014 134* 137 - 147 mmol/L Final  . Alkaline  Phosphatase 09/07/2014 42  25 - 125 U/L Final  . ALT 09/07/2014 24  7 - 35 U/L Final  . AST 09/07/2014 29  13 - 35 U/L Final  . Bilirubin, Total 09/07/2014 0.6   Final     Assessment/Plan  Iliotibial band syndrome of right side -Celebrex 200 mg daily -Apply ice 4 times daily to painful area. If this does not work then applying warmth may be helpful. Diffusing warmth use the heating pad on low or get a microwavable pad to use 4 times daily. -Aspercreme or Myoflex up to 4 times daily Stretching exercises for iliotibial band syndrome

## 2015-01-01 ENCOUNTER — Other Ambulatory Visit: Payer: Self-pay | Admitting: Internal Medicine

## 2015-01-02 ENCOUNTER — Other Ambulatory Visit: Payer: Self-pay

## 2015-01-02 MED ORDER — METOPROLOL TARTRATE 50 MG PO TABS: ORAL_TABLET | ORAL | Status: DC

## 2015-01-30 ENCOUNTER — Other Ambulatory Visit: Payer: Self-pay

## 2015-01-30 ENCOUNTER — Non-Acute Institutional Stay: Payer: Medicare Other | Admitting: Internal Medicine

## 2015-01-30 ENCOUNTER — Encounter: Payer: Self-pay | Admitting: Internal Medicine

## 2015-01-30 VITALS — BP 146/84 | HR 64 | Temp 97.2°F | Wt 134.0 lb

## 2015-01-30 DIAGNOSIS — M4806 Spinal stenosis, lumbar region: Secondary | ICD-10-CM | POA: Diagnosis not present

## 2015-01-30 DIAGNOSIS — M48061 Spinal stenosis, lumbar region without neurogenic claudication: Secondary | ICD-10-CM

## 2015-01-30 DIAGNOSIS — R609 Edema, unspecified: Secondary | ICD-10-CM

## 2015-01-30 DIAGNOSIS — I10 Essential (primary) hypertension: Secondary | ICD-10-CM

## 2015-01-30 DIAGNOSIS — R202 Paresthesia of skin: Secondary | ICD-10-CM | POA: Diagnosis not present

## 2015-01-30 DIAGNOSIS — M7631 Iliotibial band syndrome, right leg: Secondary | ICD-10-CM | POA: Diagnosis not present

## 2015-01-30 MED ORDER — LOSARTAN POTASSIUM 50 MG PO TABS: ORAL_TABLET | ORAL | Status: DC

## 2015-01-30 MED ORDER — METOPROLOL TARTRATE 50 MG PO TABS: ORAL_TABLET | ORAL | Status: DC

## 2015-01-30 MED ORDER — ESTRADIOL 0.1 MG/GM VA CREA: TOPICAL_CREAM | VAGINAL | Status: DC

## 2015-01-30 NOTE — Progress Notes (Signed)
Patient ID: Gina Bolton, female   DOB: [birth year], 81 y.o.   MRN: [id number]    Facility [facility]    Place of Service:   CLINIC   Allergies  Allergen Reactions  . Ibuprofen   . Sulfa Antibiotics     Chief Complaint  Patient presents with  . Medical Management of Chronic Issues    blood pressure, edema    HPI:  Essential hypertension: mild elevation in the SBP today. Usually 135 or less at home. Tolerating current medication without side effect. Edema improved.  Iliotibial band syndrome of right side: Still has discomfort when rising from a chair. Overall there has been improvement in her discomfort. No significant pain when she is up and walking. She is able to move throughout the facility with little discomfort.  Edema; improved with change in her blood pressure medication  Paresthesia of bilateral legs: Continues to have a numb feeling in the legs bilaterally. There is not a significant amount of pain going into the legs. Most likely cause of this is her known spinal stenosis in the lumbar region. There is no loss of bladder or bowel control.  Spinal stenosis, lumbar region, without neurogenic claudication: Last x-rays of the lumbar spine were in 2010.    Medications: Patient's Medications  New Prescriptions   No medications on file  Previous Medications   CALCIUM CARB-CHOLECALCIFEROL (CALCIUM + D3) 600-200 MG-UNIT TABS    Take by mouth. Take one tablet twice daily   CHOLECALCIFEROL (VITAMIN D-400 PO)    Take by mouth. Take one daily for vitamin D supplement   CYCLOSPORINE (RESTASIS) 0.05 % OPHTHALMIC EMULSION    1 drop every 12 (twelve) hours. One drop into left eye twice daily   DESONIDE (DESOWEN) 0.05 % CREAM    Apply topically 2 (two) times daily. As needed   ESTRADIOL (ESTRACE) 0.1 MG/GM VAGINAL CREAM    Apply three times per week   HYDROCHLOROTHIAZIDE (HYDRODIURIL) 25 MG TABLET    TAKE 1 TABLET (25 MG TOTAL) BY MOUTH DAILY. FOR BLOOD PRESSURE   LOSARTAN (COZAAR)  50 MG TABLET    One daily to lower BP   MAGNESIUM 400 MG CAPS    Take by mouth. Take one daily for leg cramps   METOPROLOL (LOPRESSOR) 50 MG TABLET    One daily in morning to control blood pressure   METRONIDAZOLE (METROGEL) 0.75 % GEL    Apply topically 2 (two) times daily.   MULTIPLE VITAMINS-MINERALS ([employer] SPECTRAVITE PO)    Take by mouth. One daily for eyes   NAPROXEN SODIUM (ALEVE) 220 MG TABLET    Take 220 mg by mouth. Take two tablets daily as needed for pain   RALOXIFENE (EVISTA) 60 MG TABLET    Take 60 mg by mouth daily. To treat osteoporosis   SODIUM CHLORIDE (MURO 128) 5 % OPHTHALMIC SOLUTION    1 drop as needed. One drop in right eye three times daily   VITAMIN C (ASCORBIC ACID) 500 MG TABLET    Take 500 mg by mouth daily.  Modified Medications   No medications on file  Discontinued Medications   METOPROLOL (LOPRESSOR) 50 MG TABLET    TAKE 1 TABLET BY MOUTH DAILY FOR BLOOD PRESSURE (TAKE WITH A SNACK)     Review of Systems  Constitutional: Negative for fever, chills, diaphoresis, activity change, appetite change, fatigue and unexpected weight change.  HENT: Positive for hearing loss. Negative for congestion, ear pain, mouth sores, rhinorrhea, sinus pressure and sore throat.  Eyes: Positive for visual disturbance (Rx lenses).       Ocular migraine right eye.  Respiratory: Negative for cough, chest tightness, wheezing and stridor.   Cardiovascular: Negative for chest pain, palpitations and leg swelling.  Gastrointestinal: Negative for nausea, abdominal pain, diarrhea, constipation and abdominal distention.  Endocrine: Negative.  Negative for cold intolerance, heat intolerance and polyuria.  Genitourinary:       Chronic leakage. Has had urologic evaluation.  Musculoskeletal: Positive for back pain and neck pain. Negative for myalgias, joint swelling, arthralgias, gait problem and neck stiffness.       New pain in the right leg laterally extending from the greater trochanter to  the lateral aspect of the right knee. Started about 11/21/14.  Skin: Negative.   Allergic/Immunologic: Negative.   Neurological: Negative for dizziness, tremors, seizures, syncope, facial asymmetry, speech difficulty, weakness, light-headedness, numbness and headaches.       Some balance issues. No falls.  Hematological: Negative.   Psychiatric/Behavioral: Negative.     Filed Vitals:   01/30/15 0927  BP: 146/84  Pulse: 64  Temp: 97.2 F (36.2 C)  TempSrc: Oral  Weight: 134 lb (60.782 kg)   Body mass index is 22.3 kg/(m^2).  Physical Exam  Constitutional: She is oriented to person, place, and time. She appears well-developed and well-nourished. No distress.  HENT:  Head: Normocephalic and atraumatic.  Right Ear: External ear normal.  Left Ear: External ear normal.  Nose: Nose normal.  Mouth/Throat: Oropharynx is clear and moist.  Eyes: Conjunctivae and EOM are normal. Pupils are equal, round, and reactive to light.  Corrective lenses.  Neck: Neck supple. No JVD present. No tracheal deviation present. No thyromegaly present.  Cardiovascular: Normal rate, regular rhythm, normal heart sounds and intact distal pulses.  Exam reveals no gallop and no friction rub.   No murmur heard. Pulmonary/Chest: No respiratory distress. She has no wheezes. She has no rales. She exhibits no tenderness.  Abdominal: She exhibits no distension and no mass. There is no tenderness.  Musculoskeletal: Normal range of motion. She exhibits no edema or tenderness.  Tender at coccyx. Tender on palpation from the right greater trochanter down the right lateral thigh to the right knee. This area is compatible with the iliotibial ligament.  Lymphadenopathy:    She has no cervical adenopathy.  Neurological: She is alert and oriented to person, place, and time. She has normal reflexes. No cranial nerve deficit. Coordination normal.  Skin: No rash noted. No erythema. No pallor.  Psychiatric: She has a normal mood  and affect. Her behavior is normal. Judgment and thought content normal.     Labs reviewed: No visits with results within 3 Month(s) from this visit. Latest known visit with results is:  Nursing Home on 09/19/2014  Component Date Value Ref Range Status  . Glucose 09/07/2014 94   Final  . BUN 09/07/2014 12  4 - 21 mg/dL Final  . Creatinine 09/07/2014 0.7  0.5 - 1.1 mg/dL Final  . Potassium 09/07/2014 3.7  3.4 - 5.3 mmol/L Final  . Sodium 09/07/2014 134* 137 - 147 mmol/L Final  . Alkaline Phosphatase 09/07/2014 42  25 - 125 U/L Final  . ALT 09/07/2014 24  7 - 35 U/L Final  . AST 09/07/2014 29  13 - 35 U/L Final  . Bilirubin, Total 09/07/2014 0.6   Final     Assessment/Plan  1. Essential hypertension Adequately controlled  2. Iliotibial band syndrome of right side Improved. Discussed x-rays of the pelvis  and right hip and possible orthopedic consultation with the patient. She wants to wait a while longer before doing this.  3. Edema Improved  4. Paresthesia of bilateral legs Similar to complaints over the last year. Most likely related to lumbar spinal stenosis. Discussed PNCV testing and possible neurologic referral. She does not feel this is necessary at this time. She is agreeable to "watchful waiting" or "' careful observation".  5. Spinal stenosis, lumbar region, without neurogenic claudication Discussed previous x-ray report. Patient does not feel consultation with specialist is necessary at this time.

## 2015-02-01 ENCOUNTER — Encounter: Payer: Self-pay | Admitting: Nurse Practitioner

## 2015-02-01 ENCOUNTER — Ambulatory Visit (INDEPENDENT_AMBULATORY_CARE_PROVIDER_SITE_OTHER): Payer: Medicare Other | Admitting: Nurse Practitioner

## 2015-02-01 VITALS — BP 134/86 | HR 69 | Temp 97.8°F | Resp 10 | Ht 65.0 in | Wt 135.0 lb

## 2015-02-01 DIAGNOSIS — I10 Essential (primary) hypertension: Secondary | ICD-10-CM | POA: Diagnosis not present

## 2015-02-01 NOTE — Progress Notes (Signed)
Patient ID: Gina Bolton, female   DOB: 1920/06/28, 81 y.o.   MRN: [id number]    PCP: Estill Dooms, MD  Allergies  Allergen Reactions  . Ibuprofen   . Sulfa Antibiotics     Chief Complaint  Patient presents with  . Hypertension    B/P follow-up      HPI: Patient is a 81 y.o. female seen in the office today who presents today with follow up of hypertension. 1. Hypertension - Patient reports having a reading of 192/98 this morning.  She says that this at home reading was taken after breakfast and about 30 minutes after her metoprolol.  However, she does keep a log of her blood pressures at home and reports that she never has readings of systolic BP over 010.  She was seen by Dr. Nyoka Cowden 2 days ago and her in office blood pressure that day was 146/84.  She says that she is anxious because she does not want to have a stoke.  Also, recently is stressed with the news that her husband has been diagnosed with Alzheimer's disease.  Denies headaches, blurred vision.   No chest pain or shortness of breath.     Review of Systems:  Review of Systems  Constitutional: Negative for fever, chills and fatigue.  Respiratory: Negative for cough, shortness of breath and wheezing.   Cardiovascular: Negative for chest pain, palpitations and leg swelling.  Musculoskeletal: Negative for back pain, joint swelling and gait problem.  Skin: Negative for color change, pallor, rash and wound.  Psychiatric/Behavioral: Negative for confusion and decreased concentration. The patient is nervous/anxious.     Past Medical History  Diagnosis Date  . Dysphonia 01/25/2013  . Mild cognitive impairment, so stated 12/02/2011  . Chronic rhinitis 12/02/2011  . Urgency of urination 08/26/2011  . Edema 10/09/2010  . Spinal stenosis in cervical region 09/10/2010  . Closed fracture of second cervical vertebra without mention of spinal cord injury 09/10/2010  . Urinary frequency 07/30/2010  . Closed fracture of unspecified  part of ramus of mandible 06/11/2010  . Cervicalgia 11/20/2009  . Shortness of breath 11/20/2009  . Spinal stenosis, lumbar region, without neurogenic claudication 08/13/2009  . Unspecified hereditary and idiopathic peripheral neuropathy 05/08/2009  . Rosacea 10/24/2008  . Hemangioma of other sites 9//2008  . Other sequelae of chronic liver disease 08/31/2007  . Acquired cyst of kidney 9///2008  . Unspecified constipation 02/23/2007  . Neurogenic bladder, NOS 02/23/2007  . Obstructive sleep apnea (adult) (pediatric) 04/21/2006  . Abnormality of gait 11/11/2005  . Disturbance of skin sensation 11/11/2005  . Unspecified essential hypertension 08/02/2002  . Unspecified hypothyroidism 04/19/2001  . Other and unspecified hyperlipidemia 03/09/2001  . Osteoarthrosis, unspecified whether generalized or localized, unspecified site 03/09/2001  . Senile osteoporosis 03/09/2001  . Hereditary spherocytosis 01/14/2000  . Allergic rhinitis, cause unspecified 01/14/2000  . Cramp of limb 01/14/2000  . Personal history of malignant neoplasm of breast 01/14/2000  . Malignant neoplasm of breast (female), unspecified site 05/24/1997    left  . Disorder of bone and cartilage, unspecified 05/04/1995   Past Surgical History  Procedure Laterality Date  . Tonsillectomy    . Abdominal hysterectomy  1974  . Breast lumpectomy Left 1998    Dr. Coletta Memos  . Bladder repair    . Cataract extraction w/ intraocular lens implant  07/1999    Dr. Bing Plume  . Corneal transplant Left 2002  . Colonoscopy  02/28/1999    no polyps small internal hemorrhoid noted  Social History:   reports that she has never smoked. She has never used smokeless tobacco. She reports that she drinks about 0.6 oz of alcohol per week. She reports that she does not use illicit drugs.  Family History  Problem Relation Age of Onset  . Cancer Father     lung  . Cancer Sister     breast     Medications: Patient's Medications  New Prescriptions   No  medications on file  Previous Medications   CALCIUM CARB-CHOLECALCIFEROL (CALCIUM + D3) 600-200 MG-UNIT TABS    Take by mouth. Take one tablet twice daily   CHOLECALCIFEROL (VITAMIN D-400 PO)    Take by mouth. Take one daily for vitamin D supplement   CYCLOSPORINE (RESTASIS) 0.05 % OPHTHALMIC EMULSION    1 drop every 12 (twelve) hours. One drop into left eye twice daily   DESONIDE (DESOWEN) 0.05 % CREAM    Apply topically 2 (two) times daily. As needed   ESTRADIOL (ESTRACE) 0.1 MG/GM VAGINAL CREAM    Apply three times per week   HYDROCHLOROTHIAZIDE (HYDRODIURIL) 25 MG TABLET    TAKE 1 TABLET (25 MG TOTAL) BY MOUTH DAILY. FOR BLOOD PRESSURE   LOSARTAN (COZAAR) 50 MG TABLET    One daily to lower BP   MAGNESIUM 400 MG CAPS    Take by mouth. Take one daily for leg cramps   METOPROLOL (LOPRESSOR) 50 MG TABLET    One daily in morning to control blood pressure   METRONIDAZOLE (METROGEL) 0.75 % GEL    Apply topically 2 (two) times daily.   MULTIPLE VITAMINS-MINERALS ([employer] SPECTRAVITE PO)    Take by mouth. One daily for eyes   NAPROXEN SODIUM (ALEVE) 220 MG TABLET    Take 220 mg by mouth. Take two tablets daily as needed for pain   RALOXIFENE (EVISTA) 60 MG TABLET    Take 60 mg by mouth daily. To treat osteoporosis   SODIUM CHLORIDE (MURO 128) 5 % OPHTHALMIC SOLUTION    1 drop as needed. One drop in right eye three times daily   VITAMIN C (ASCORBIC ACID) 500 MG TABLET    Take 500 mg by mouth daily.  Modified Medications   No medications on file  Discontinued Medications   No medications on file     Physical Exam:  Filed Vitals:   02/01/15 1512  BP: 134/86  Pulse: 69  Temp: 97.8 F (36.6 C)  TempSrc: Oral  Resp: 10  Height: 5\' 5"  (1.651 m)  Weight: 135 lb (61.236 kg)  SpO2: 97%    Physical Exam  Constitutional: She is oriented to person, place, and time. She appears well-developed and well-nourished.  HENT:  Head: Normocephalic and atraumatic.  Cardiovascular: Normal rate, regular  rhythm, normal heart sounds and intact distal pulses.   Pulmonary/Chest: Effort normal and breath sounds normal.  Musculoskeletal: Normal range of motion.  Neurological: She is alert and oriented to person, place, and time.  Skin: Skin is warm and dry.  Psychiatric: She has a normal mood and affect.    Labs reviewed: Basic Metabolic Panel:  Recent Labs  03/27/14 09/07/14  NA 136* 134*  K 3.6 3.7  BUN 10 12  CREATININE 0.6 0.7   Liver Function Tests:  Recent Labs  03/27/14 09/07/14  AST 30 29  ALT 17 24  ALKPHOS 43 42   No results for input(s): LIPASE, AMYLASE in the last 8760 hours. No results for input(s): AMMONIA in the last 8760 hours. CBC: No  results for input(s): WBC, NEUTROABS, HGB, HCT, MCV, PLT in the last 8760 hours. Lipid Panel:  Recent Labs  03/27/14  CHOL 177  HDL 63  LDLCALC 103  TRIG 54   TSH: No results for input(s): TSH in the last 8760 hours. A1C: No results found for: HGBA1C   Assessment/Plan 1. Hypertension  Blood pressure at goal today and at previous visit for age. Stable on meds.  Advised patient to take medications and wait at least one hour before taking blood pressure.  If she does get a high reading, wait one hour and rest, then re check when relaxed. (pt with increased anxiety due to husbands recent dementia diagnosis).  Advised her to continue taking home blood pressures and recording them in her log, if she gets more high readings, make an appointment and bring blood pressure log with her.  Make sure she is not eating foods high in sodium.   Can follow up as needed.

## 2015-02-08 ENCOUNTER — Other Ambulatory Visit: Payer: Self-pay | Admitting: *Deleted

## 2015-02-08 MED ORDER — ESTRADIOL 0.1 MG/GM VA CREA: TOPICAL_CREAM | VAGINAL | Status: DC

## 2015-02-08 NOTE — Telephone Encounter (Signed)
[facility] 

## 2015-02-13 ENCOUNTER — Other Ambulatory Visit: Payer: Self-pay | Admitting: Internal Medicine

## 2015-03-13 ENCOUNTER — Encounter: Payer: Self-pay | Admitting: Internal Medicine

## 2015-03-13 ENCOUNTER — Non-Acute Institutional Stay: Payer: Medicare Other | Admitting: Internal Medicine

## 2015-03-13 VITALS — BP 132/82 | HR 72 | Temp 98.1°F | Wt 136.0 lb

## 2015-03-13 DIAGNOSIS — M7631 Iliotibial band syndrome, right leg: Secondary | ICD-10-CM

## 2015-03-13 MED ORDER — RALOXIFENE HCL 60 MG PO TABS: 60.0000 mg | ORAL_TABLET | Freq: Every day | ORAL | Status: DC

## 2015-03-13 NOTE — Progress Notes (Signed)
Patient ID: Gina Bolton, female   DOB: [birth year], 81 y.o.   MRN: [id number]    [place]     Place of Service: Clinic (12)    Allergies  Allergen Reactions  . Ibuprofen   . Sulfa Antibiotics     Chief Complaint  Patient presents with  . Hip Pain    right, goes down leg. Can't put pressure on right leg, can't go up steps. Can walk on it ok without pain Started in Dec., worse now.    HPI:  Iliotibial band syndrome of right side: Began having pain at the right hip going laterally down the leg almost to the knee beginning 12/02/2014. She says she can't put pressure on the right leg. She has trouble initiating walking but then the discomfort stops as she continues to walk. She cannot go up steps. Leg has not buckled. There is no known injury.    Medications: Patient's Medications  New Prescriptions   No medications on file  Previous Medications   CALCIUM CARB-CHOLECALCIFEROL (CALCIUM + D3) 600-200 MG-UNIT TABS    Take by mouth. Take one tablet twice daily   CHOLECALCIFEROL (VITAMIN D-400 PO)    Take by mouth. Take one daily for vitamin D supplement   CYCLOSPORINE (RESTASIS) 0.05 % OPHTHALMIC EMULSION    1 drop every 12 (twelve) hours. One drop into left eye twice daily   DESONIDE (DESOWEN) 0.05 % CREAM    Apply topically 2 (two) times daily. As needed   ESTRADIOL (ESTRACE) 0.1 MG/GM VAGINAL CREAM    Apply 1 gm to vaginal area 3 times a week as directed.   HYDROCHLOROTHIAZIDE (HYDRODIURIL) 25 MG TABLET    TAKE 1 TABLET (25 MG TOTAL) BY MOUTH DAILY. FOR BLOOD PRESSURE   LOSARTAN (COZAAR) 50 MG TABLET    One daily to lower BP   MAGNESIUM 400 MG CAPS    Take by mouth. Take one daily for leg cramps   METOPROLOL (LOPRESSOR) 50 MG TABLET    One daily in morning to control blood pressure   METRONIDAZOLE (METROGEL) 0.75 % GEL    Apply topically 2 (two) times daily.   MULTIPLE VITAMINS-MINERALS ([employer] SPECTRAVITE PO)    Take by mouth. One daily for eyes   NAPROXEN  SODIUM (ALEVE) 220 MG TABLET    Take 220 mg by mouth. Take two tablets daily as needed for pain   SODIUM CHLORIDE (MURO 128) 5 % OPHTHALMIC SOLUTION    1 drop as needed. One drop in right eye three times daily   VITAMIN C (ASCORBIC ACID) 500 MG TABLET    Take 500 mg by mouth daily.  Modified Medications   Modified Medication Previous Medication   RALOXIFENE (EVISTA) 60 MG TABLET raloxifene (EVISTA) 60 MG tablet      Take 1 tablet (60 mg total) by mouth daily. To treat osteoporosis    Take 60 mg by mouth daily. To treat osteoporosis  Discontinued Medications   No medications on file     Review of Systems  Constitutional: Negative for fever, chills, diaphoresis, activity change, appetite change, fatigue and unexpected weight change.  HENT: Positive for hearing loss. Negative for congestion, ear pain, mouth sores, rhinorrhea, sinus pressure and sore throat.   Eyes: Positive for visual disturbance (Rx lenses).       Ocular migraine right eye.  Respiratory: Negative for cough, chest tightness, wheezing and stridor.   Cardiovascular: Negative for chest pain, palpitations and leg swelling.  Gastrointestinal: Negative for nausea,  abdominal pain, diarrhea, constipation and abdominal distention.  Endocrine: Negative.  Negative for cold intolerance, heat intolerance and polyuria.  Genitourinary:       Chronic leakage. Has had urologic evaluation.  Musculoskeletal: Positive for back pain and neck pain. Negative for myalgias, joint swelling, arthralgias, gait problem and neck stiffness.       Pain in the right leg laterally extending from the greater trochanter to the lateral aspect of the right knee. Started about 12/02/14.  Skin: Negative.   Allergic/Immunologic: Negative.   Neurological: Negative for dizziness, tremors, seizures, syncope, facial asymmetry, speech difficulty, weakness, light-headedness, numbness and headaches.       Some balance issues. No falls.  Hematological: Negative.     Psychiatric/Behavioral: Negative.     Filed Vitals:   03/13/15 0900  BP: 132/82  Pulse: 72  Temp: 98.1 F (36.7 C)  TempSrc: Oral  Weight: 136 lb (61.689 kg)   Body mass index is 22.63 kg/(m^2).  Physical Exam  Constitutional: She is oriented to person, place, and time. She appears well-developed and well-nourished. No distress.  HENT:  Head: Normocephalic and atraumatic.  Right Ear: External ear normal.  Left Ear: External ear normal.  Nose: Nose normal.  Mouth/Throat: Oropharynx is clear and moist.  Eyes: Conjunctivae and EOM are normal. Pupils are equal, round, and reactive to light.  Corrective lenses.  Neck: Neck supple. No JVD present. No tracheal deviation present. No thyromegaly present.  Cardiovascular: Normal rate, regular rhythm, normal heart sounds and intact distal pulses.  Exam reveals no gallop and no friction rub.   No murmur heard. Pulmonary/Chest: No respiratory distress. She has no wheezes. She has no rales. She exhibits no tenderness.  Abdominal: She exhibits no distension and no mass. There is no tenderness.  Musculoskeletal: Normal range of motion. She exhibits no edema or tenderness.  Tender at coccyx. Tender on palpation from the right greater trochanter down the right lateral thigh to the right knee. This area is compatible with the iliotibial ligament.  Lymphadenopathy:    She has no cervical adenopathy.  Neurological: She is alert and oriented to person, place, and time. She has normal reflexes. No cranial nerve deficit. Coordination normal.  Skin: No rash noted. No erythema. No pallor.  Psychiatric: She has a normal mood and affect. Her behavior is normal. Judgment and thought content normal.     Labs reviewed: No visits with results within 3 Month(s) from this visit. Latest known visit with results is:  Nursing Home on 09/19/2014  Component Date Value Ref Range Status  . Glucose 09/07/2014 94   Final  . BUN 09/07/2014 12  4 - 21 mg/dL  Final  . Creatinine 09/07/2014 0.7  0.5 - 1.1 mg/dL Final  . Potassium 09/07/2014 3.7  3.4 - 5.3 mmol/L Final  . Sodium 09/07/2014 134* 137 - 147 mmol/L Final  . Alkaline Phosphatase 09/07/2014 42  25 - 125 U/L Final  . ALT 09/07/2014 24  7 - 35 U/L Final  . AST 09/07/2014 29  13 - 35 U/L Final  . Bilirubin, Total 09/07/2014 0.6   Final     Assessment/Plan  1. Iliotibial band syndrome of right side Over 3 months of variable intensity discomfort of the right lateral leg from the hip almost down to the knee. Diagnosis of far has been iliotibial syndrome. Although this is known to be a chronic problem, I feel it is appropriate to get orthopedic consultation to be sure we're not missing something else. - Ambulatory  referral to Orthopedic Surgery

## 2015-03-20 DIAGNOSIS — M25551 Pain in right hip: Secondary | ICD-10-CM | POA: Diagnosis not present

## 2015-03-20 DIAGNOSIS — M545 Low back pain: Secondary | ICD-10-CM | POA: Diagnosis not present

## 2015-04-27 ENCOUNTER — Telehealth: Payer: Self-pay

## 2015-04-27 NOTE — Telephone Encounter (Signed)
Patient has an appointment with Dr. Nyoka Cowden Tuesday 05/01/15, wonders if she could get a urine test done prior.  Her urine is cloudy, no burning, no frequency. Told her it could be just she needs to increase her fluid in-take. She said she has and no improvement. Will contact [place] at [facility] to collect urine on Monday 04/30/15, we should get a report by Tuesday. Called [facility] and fax order. Urine - Urine culture R82.99

## 2015-04-30 DIAGNOSIS — R8299 Other abnormal findings in urine: Secondary | ICD-10-CM | POA: Diagnosis not present

## 2015-05-01 ENCOUNTER — Encounter: Payer: Self-pay | Admitting: Internal Medicine

## 2015-05-01 ENCOUNTER — Non-Acute Institutional Stay: Payer: Medicare Other | Admitting: Internal Medicine

## 2015-05-01 VITALS — BP 136/72 | HR 62 | Temp 98.0°F | Wt 137.0 lb

## 2015-05-01 DIAGNOSIS — L719 Rosacea, unspecified: Secondary | ICD-10-CM

## 2015-05-01 DIAGNOSIS — R3915 Urgency of urination: Secondary | ICD-10-CM

## 2015-05-01 DIAGNOSIS — R8290 Unspecified abnormal findings in urine: Secondary | ICD-10-CM | POA: Diagnosis not present

## 2015-05-01 DIAGNOSIS — R2681 Unsteadiness on feet: Secondary | ICD-10-CM | POA: Diagnosis not present

## 2015-05-01 DIAGNOSIS — I10 Essential (primary) hypertension: Secondary | ICD-10-CM | POA: Diagnosis not present

## 2015-05-01 MED ORDER — METRONIDAZOLE 0.75 % EX GEL: CUTANEOUS | Status: DC

## 2015-05-01 NOTE — Progress Notes (Signed)
Patient ID: Gina Bolton, female   DOB: 05-14-1920, 81 y.o.   MRN: [id number]    [facility]     Place of Service: Clinic (12)     Allergies  Allergen Reactions  . Ibuprofen   . Sulfa Antibiotics     Chief Complaint  Patient presents with  . Dizziness    unsteady balance - worse lately, doesn't know if it is in her head or legs  . urine    cloudy, patient called office 04/27/15 about cloudy urine, urine test ordered done 04/30/15    HPI:  Essential hypertension: slightly elevated BP on the fiest check returnto normal stae after laying down.  Urgency of urination: recurrent issue  Unstable gait: chronic  Abnormal urine: cloudy appearance per patient  Rosacea - requests metroNIDAZOLE (METROGEL) 0.75 % gel    Medications: Patient's Medications  New Prescriptions   No medications on file  Previous Medications   CALCIUM CARB-CHOLECALCIFEROL (CALCIUM + D3) 600-200 MG-UNIT TABS    Take by mouth. Take one tablet twice daily   CHOLECALCIFEROL (VITAMIN D-400 PO)    Take by mouth. Take one daily for vitamin D supplement   CYCLOSPORINE (RESTASIS) 0.05 % OPHTHALMIC EMULSION    1 drop every 12 (twelve) hours. One drop into left eye twice daily   DESONIDE (DESOWEN) 0.05 % CREAM    Apply topically 2 (two) times daily. As needed   ESTRADIOL (ESTRACE) 0.1 MG/GM VAGINAL CREAM    Apply 1 gm to vaginal area 3 times a week as directed.   HYDROCHLOROTHIAZIDE (HYDRODIURIL) 25 MG TABLET    TAKE 1 TABLET (25 MG TOTAL) BY MOUTH DAILY. FOR BLOOD PRESSURE   LOSARTAN (COZAAR) 50 MG TABLET    One daily to lower BP   MAGNESIUM 400 MG CAPS    Take by mouth. Take one daily for leg cramps   METOPROLOL (LOPRESSOR) 50 MG TABLET    One daily in morning to control blood pressure   METRONIDAZOLE (METROGEL) 0.75 % GEL    Apply topically 2 (two) times daily.   MULTIPLE VITAMINS-MINERALS ([employer]S SPECTRAVITE PO)    Take by mouth. One daily for eyes   NAPROXEN SODIUM (ALEVE) 220 MG TABLET    Take  220 mg by mouth. Take two tablets daily as needed for pain   RALOXIFENE (EVISTA) 60 MG TABLET    Take 1 tablet (60 mg total) by mouth daily. To treat osteoporosis   SODIUM CHLORIDE (MURO 128) 5 % OPHTHALMIC SOLUTION    1 drop as needed. One drop in right eye three times daily   VITAMIN C (ASCORBIC ACID) 500 MG TABLET    Take 500 mg by mouth daily.  Modified Medications   No medications on file  Discontinued Medications   No medications on file     Review of Systems  Constitutional: Negative for fever, chills, diaphoresis, activity change, appetite change, fatigue and unexpected weight change.  HENT: Positive for hearing loss. Negative for congestion, ear pain, mouth sores, rhinorrhea, sinus pressure and sore throat.   Eyes: Positive for visual disturbance (Rx lenses).       Ocular migraine right eye.  Respiratory: Negative for cough, chest tightness, wheezing and stridor.   Cardiovascular: Negative for chest pain, palpitations and leg swelling.  Gastrointestinal: Negative for nausea, abdominal pain, diarrhea, constipation and abdominal distention.  Endocrine: Negative.  Negative for cold intolerance, heat intolerance and polyuria.  Genitourinary:       Chronic leakage. Has had urologic evaluation.  Musculoskeletal: Positive for back pain, gait problem (unsteady balance) and neck pain. Negative for myalgias, joint swelling, arthralgias and neck stiffness.       Pain in the right leg laterally extending from the greater trochanter to the lateral aspect of the right knee. Started about 12/02/14.  Skin: Negative.   Allergic/Immunologic: Negative.   Neurological: Negative for dizziness, tremors, seizures, syncope, facial asymmetry, speech difficulty, weakness, light-headedness, numbness and headaches.       Some balance issues. No falls.  Hematological: Negative.   Psychiatric/Behavioral: Negative.     Filed Vitals:   05/01/15 1139 05/01/15 1342  BP: 148/82 136/72  Pulse: 62   Temp: 98  F (36.7 C)   TempSrc: Oral   Weight: 137 lb (62.143 kg)    Body mass index is 22.8 kg/(m^2).  Physical Exam  Constitutional: She is oriented to person, place, and time. She appears well-developed and well-nourished. No distress.  HENT:  Head: Normocephalic and atraumatic.  Right Ear: External ear normal.  Left Ear: External ear normal.  Nose: Nose normal.  Mouth/Throat: Oropharynx is clear and moist.  Eyes: Conjunctivae and EOM are normal. Pupils are equal, round, and reactive to light.  Corrective lenses.  Neck: Neck supple. No JVD present. No tracheal deviation present. No thyromegaly present.  Cardiovascular: Normal rate, regular rhythm, normal heart sounds and intact distal pulses.  Exam reveals no gallop and no friction rub.   No murmur heard. Pulmonary/Chest: No respiratory distress. She has no wheezes. She has no rales. She exhibits no tenderness.  Abdominal: She exhibits no distension and no mass. There is no tenderness.  Musculoskeletal: Normal range of motion. She exhibits no edema or tenderness.  Tender at coccyx. Tender on palpation from the right greater trochanter down the right lateral thigh to the right knee. This area is compatible with the iliotibial ligament.  Lymphadenopathy:    She has no cervical adenopathy.  Neurological: She is alert and oriented to person, place, and time. She has normal reflexes. No cranial nerve deficit. Coordination normal.  Skin: No rash noted. No erythema. No pallor.  Psychiatric: She has a normal mood and affect. Her behavior is normal. Judgment and thought content normal.     Labs reviewed: No visits with results within 3 Month(s) from this visit. Latest known visit with results is:  Nursing Home on 09/19/2014  Component Date Value Ref Range Status  . Glucose 09/07/2014 94   Final  . BUN 09/07/2014 12  4 - 21 mg/dL Final  . Creatinine 09/07/2014 0.7  0.5 - 1.1 mg/dL Final  . Potassium 09/07/2014 3.7  3.4 - 5.3 mmol/L Final    . Sodium 09/07/2014 134* 137 - 147 mmol/L Final  . Alkaline Phosphatase 09/07/2014 42  25 - 125 U/L Final  . ALT 09/07/2014 24  7 - 35 U/L Final  . AST 09/07/2014 29  13 - 35 U/L Final  . Bilirubin, Total 09/07/2014 0.6   Final     Assessment/Plan   1. Essential hypertension Increase losartan to 100 mg daily  2. Urgency of urination Waiting for urine culture report  3. Unstable gait Use caution with walking  4. Abnormal urine Cloudy appearance. Waiting for culture report.  5. Rosacea - metroNIDAZOLE (METROGEL) 0.75 % gel; Apply topically twice daly to help rash  Dispense: 45 g; Refill: 3

## 2015-05-03 DIAGNOSIS — R8290 Unspecified abnormal findings in urine: Secondary | ICD-10-CM | POA: Insufficient documentation

## 2015-05-03 DIAGNOSIS — R2681 Unsteadiness on feet: Secondary | ICD-10-CM | POA: Insufficient documentation

## 2015-05-03 MED ORDER — LOSARTAN POTASSIUM 100 MG PO TABS: ORAL_TABLET | ORAL | Status: DC

## 2015-05-07 ENCOUNTER — Other Ambulatory Visit: Payer: Self-pay | Admitting: *Deleted

## 2015-05-07 MED ORDER — NITROFURANTOIN MONOHYD MACRO 100 MG PO CAPS: 100.0000 mg | ORAL_CAPSULE | Freq: Two times a day (BID) | ORAL | Status: DC

## 2015-05-07 NOTE — Patient Instructions (Signed)
Called patient's home and spoke with spouse, informed him that medication for UTI had been sent to [facility]. He stated that he would let her know about the prescription and they would call before going to pick it up.

## 2015-05-08 ENCOUNTER — Telehealth: Payer: Self-pay

## 2015-05-08 MED ORDER — AMOXICILLIN 500 MG PO CAPS: ORAL_CAPSULE | ORAL | Status: DC

## 2015-05-08 NOTE — Telephone Encounter (Signed)
Patient came by [facility] clinic, she took one Macrobid having face swelling and flush.  Per Dr. Nyoka Cowden d/c Macrobid will fax to [facility] Amoxicillin 500 mg #21 one three times daily for infection. Told patient she will pick it up this afternoon.

## 2015-05-10 NOTE — Addendum Note (Signed)
Addended by: Estill Dooms on: 05/10/2015 01:45 PM   Modules accepted: Level of Service

## 2015-05-15 ENCOUNTER — Telehealth: Payer: Self-pay

## 2015-05-15 MED ORDER — RALOXIFENE HCL 60 MG PO TABS: 60.0000 mg | ORAL_TABLET | Freq: Every day | ORAL | Status: DC

## 2015-05-15 NOTE — Telephone Encounter (Signed)
Patient came to [facility] clinic hasn't sent Rx from 03/13/15 to [facility], she thought she had and found it today. She is out and would #20 called to [facility] today. Faxed.

## 2015-05-24 ENCOUNTER — Encounter: Payer: Self-pay | Admitting: Internal Medicine

## 2015-05-28 ENCOUNTER — Encounter: Payer: Self-pay | Admitting: Internal Medicine

## 2015-06-05 DIAGNOSIS — H532 Diplopia: Secondary | ICD-10-CM | POA: Diagnosis not present

## 2015-06-05 DIAGNOSIS — H35033 Hypertensive retinopathy, bilateral: Secondary | ICD-10-CM | POA: Diagnosis not present

## 2015-06-05 DIAGNOSIS — H1851 Endothelial corneal dystrophy: Secondary | ICD-10-CM | POA: Diagnosis not present

## 2015-06-05 DIAGNOSIS — Z947 Corneal transplant status: Secondary | ICD-10-CM | POA: Diagnosis not present

## 2015-06-05 DIAGNOSIS — H524 Presbyopia: Secondary | ICD-10-CM | POA: Diagnosis not present

## 2015-06-05 DIAGNOSIS — H5015 Alternating exotropia: Secondary | ICD-10-CM | POA: Diagnosis not present

## 2015-06-05 DIAGNOSIS — H02054 Trichiasis without entropian left upper eyelid: Secondary | ICD-10-CM | POA: Diagnosis not present

## 2015-06-10 DIAGNOSIS — N390 Urinary tract infection, site not specified: Secondary | ICD-10-CM | POA: Diagnosis not present

## 2015-07-03 ENCOUNTER — Encounter: Payer: Self-pay | Admitting: Internal Medicine

## 2015-07-03 ENCOUNTER — Non-Acute Institutional Stay: Payer: Medicare Other | Admitting: Internal Medicine

## 2015-07-03 VITALS — BP 158/88 | HR 60 | Temp 97.9°F | Wt 137.0 lb

## 2015-07-03 DIAGNOSIS — F4323 Adjustment disorder with mixed anxiety and depressed mood: Secondary | ICD-10-CM | POA: Diagnosis not present

## 2015-07-03 DIAGNOSIS — R3915 Urgency of urination: Secondary | ICD-10-CM | POA: Diagnosis not present

## 2015-07-03 DIAGNOSIS — I10 Essential (primary) hypertension: Secondary | ICD-10-CM

## 2015-07-03 MED ORDER — CITALOPRAM HYDROBROMIDE 10 MG PO TABS: ORAL_TABLET | ORAL | Status: DC

## 2015-07-03 MED ORDER — DOXAZOSIN MESYLATE 4 MG PO TABS: ORAL_TABLET | ORAL | Status: DC

## 2015-07-03 NOTE — Progress Notes (Addendum)
Patient ID: Gina Bolton, female   DOB: 1920/04/16, 81 y.o.   MRN: [id number]    [facility]     Place of Service: Clinic (12)     Allergies  Allergen Reactions  . Ibuprofen   . Macrobid [Nitrofurantoin Monohyd Macro]     Swelling and flush   . Sulfa Antibiotics     Chief Complaint  Patient presents with  . Depression    about husbands health and what they are facing with his demenita  . Urinary Tract Infection    06/10/15 while in [place], next time she gets one she plans on going to Dr. Jeffie Pollock, to see why she keeps getting UTI's    HPI:  Home blood pressures continue to run high. Blood pressure was high and reconfirmed elevated in the office today. She is taking all medications as recommended. Blood pressure control medications include hydrochlorothiazide, metoprolol, and losartan.  Patient is feeling a lot of stress and chronic anxiety. Her husband has dementia and it is progressing. She worries about their finances. She thinks she will have to move to [facility] because he will need more care at the memory care unit. She worries about the separation from him.  Although she says she is feeling depression as well as the persistent anxiety, she denies loss of sleep at night, loss of appetite, weight loss, increasing aches and pains, or tearfulness. She has a clinical psychologist that she has discussed her feelings with a recommended that she go on Celexa 10 mg daily.  Patient feels that she is having too many urinary tract infections. Sometimes she double voids. She is asymptomatic at this time. She has seen Dr. Jeffie Pollock in the past. She plans to call his office for another evaluation.  Obstructive sleep apnea- patient is using CPAP regularly and is compliant. She has, however, in need of a new machine. Her current machine has become noisy.  Medications: Patient's Medications  New Prescriptions   No medications on file  Previous Medications   AMOXICILLIN (AMOXIL) 500 MG CAPSULE    Take one three times daily for infection   CALCIUM CARB-CHOLECALCIFEROL (CALCIUM + D3) 600-200 MG-UNIT TABS    Take by mouth. Take one tablet twice daily   CHOLECALCIFEROL (VITAMIN D-400 PO)    Take by mouth. Take one daily for vitamin D supplement   CYCLOSPORINE (RESTASIS) 0.05 % OPHTHALMIC EMULSION    1 drop every 12 (twelve) hours. One drop into left eye twice daily   DESONIDE (DESOWEN) 0.05 % CREAM    Apply topically 2 (two) times daily. As needed   ESTRADIOL (ESTRACE) 0.1 MG/GM VAGINAL CREAM    Apply 1 gm to vaginal area 3 times a week as directed.   HYDROCHLOROTHIAZIDE (HYDRODIURIL) 25 MG TABLET    TAKE 1 TABLET (25 MG TOTAL) BY MOUTH DAILY. FOR BLOOD PRESSURE   LOSARTAN (COZAAR) 100 MG TABLET    One daily to control BP   MAGNESIUM 400 MG CAPS    Take by mouth. Take one daily for leg cramps   METOPROLOL (LOPRESSOR) 50 MG TABLET    One daily in morning to control blood pressure   METRONIDAZOLE (METROGEL) 0.75 % GEL    Apply topically twice daly to help rash   MULTIPLE VITAMINS-MINERALS ([employer]S SPECTRAVITE PO)    Take by mouth. One daily for eyes   NAPROXEN SODIUM (ALEVE) 220 MG TABLET    Take 220 mg by mouth. Take two tablets daily as needed for pain  RALOXIFENE (EVISTA) 60 MG TABLET    Take 1 tablet (60 mg total) by mouth daily. To treat osteoporosis   SODIUM CHLORIDE (MURO 128) 5 % OPHTHALMIC SOLUTION    1 drop as needed. One drop in right eye three times daily   VITAMIN C (ASCORBIC ACID) 500 MG TABLET    Take 500 mg by mouth daily.  Modified Medications   No medications on file  Discontinued Medications   No medications on file     Review of Systems  Constitutional: Negative for fever, chills, diaphoresis, activity change, appetite change, fatigue and unexpected weight change.  HENT: Positive for hearing loss. Negative for congestion, ear pain, mouth sores, rhinorrhea, sinus pressure and sore throat.   Eyes: Positive for visual disturbance (Rx  lenses).       Ocular migraine right eye.  Respiratory: Negative for cough, chest tightness, wheezing and stridor.   Cardiovascular: Negative for chest pain, palpitations and leg swelling.  Gastrointestinal: Negative for nausea, abdominal pain, diarrhea, constipation and abdominal distention.  Endocrine: Negative.  Negative for cold intolerance, heat intolerance and polyuria.  Genitourinary:       Chronic leakage. Has had urologic evaluation.  Musculoskeletal: Positive for back pain, gait problem (unsteady balance) and neck pain. Negative for myalgias, joint swelling, arthralgias and neck stiffness.  Skin: Negative.   Allergic/Immunologic: Negative.   Neurological: Negative for dizziness, tremors, seizures, syncope, facial asymmetry, speech difficulty, weakness, light-headedness, numbness and headaches.       Some balance issues. No falls.  Hematological: Negative.   Psychiatric/Behavioral: Positive for dysphoric mood. The patient is nervous/anxious.        Constant worries about what the future holds for her and her increasingly demented husband.    Filed Vitals:   07/03/15 1022  BP: 158/88  Pulse: 60  Temp: 97.9 F (36.6 C)  TempSrc: Oral  Weight: 137 lb (62.143 kg)  SpO2: 95%   Body mass index is 22.8 kg/(m^2).  Physical Exam  Constitutional: She is oriented to person, place, and time. She appears well-developed and well-nourished. No distress.  HENT:  Head: Normocephalic and atraumatic.  Right Ear: External ear normal.  Left Ear: External ear normal.  Nose: Nose normal.  Mouth/Throat: Oropharynx is clear and moist.  Eyes: Conjunctivae and EOM are normal. Pupils are equal, round, and reactive to light.  Corrective lenses.  Neck: Neck supple. No JVD present. No tracheal deviation present. No thyromegaly present.  Cardiovascular: Normal rate, regular rhythm, normal heart sounds and intact distal pulses.  Exam reveals no gallop and no friction rub.   No murmur  heard. Pulmonary/Chest: No respiratory distress. She has no wheezes. She has no rales. She exhibits no tenderness.  Abdominal: She exhibits no distension and no mass. There is tenderness. There is guarding.  Musculoskeletal: Normal range of motion. She exhibits no edema or tenderness.  Lymphadenopathy:    She has no cervical adenopathy.  Neurological: She is alert and oriented to person, place, and time. She has normal reflexes. No cranial nerve deficit. Coordination normal.  Skin: No rash noted. No erythema. No pallor.  Psychiatric: Her behavior is normal. Judgment and thought content normal.  chronic anxiety and mild depression     Labs reviewed: No visits with results within 3 Month(s) from this visit. Latest known visit with results is:  Nursing Home on 09/19/2014  Component Date Value Ref Range Status  . Glucose 09/07/2014 94   Final  . BUN 09/07/2014 12  4 - 21 mg/dL Final  .  Creatinine 09/07/2014 0.7  0.5 - 1.1 mg/dL Final  . Potassium 09/07/2014 3.7  3.4 - 5.3 mmol/L Final  . Sodium 09/07/2014 134* 137 - 147 mmol/L Final  . Alkaline Phosphatase 09/07/2014 42  25 - 125 U/L Final  . ALT 09/07/2014 24  7 - 35 U/L Final  . AST 09/07/2014 29  13 - 35 U/L Final  . Bilirubin, Total 09/07/2014 0.6   Final     Assessment/Plan  1. Essential hypertension Continue current medications and add - doxazosin (CARDURA) 4 MG tablet; One daily to help control BP  Dispense: 30 tablet; Refill: 5  2. Adjustment disorder with mixed anxiety and depressed mood - citalopram (CELEXA) 10 MG tablet; One daily to help anxiety and depression  Dispense: 30 tablet; Refill: 3  3. Urgency of urination Patient will call Dr. Ralene Muskrat office to schedule appointment  4. Obstructive sleep apnea Patient is compliant with CPAP, but is in need of a new machine. Current machine has become noisy.

## 2015-07-21 DIAGNOSIS — S199XXA Unspecified injury of neck, initial encounter: Secondary | ICD-10-CM | POA: Diagnosis not present

## 2015-07-21 DIAGNOSIS — M85821 Other specified disorders of bone density and structure, right upper arm: Secondary | ICD-10-CM | POA: Diagnosis not present

## 2015-07-21 DIAGNOSIS — W19XXXA Unspecified fall, initial encounter: Secondary | ICD-10-CM | POA: Diagnosis not present

## 2015-07-21 DIAGNOSIS — S069X9A Unspecified intracranial injury with loss of consciousness of unspecified duration, initial encounter: Secondary | ICD-10-CM | POA: Diagnosis not present

## 2015-07-21 DIAGNOSIS — M85811 Other specified disorders of bone density and structure, right shoulder: Secondary | ICD-10-CM | POA: Diagnosis not present

## 2015-07-21 DIAGNOSIS — M79621 Pain in right upper arm: Secondary | ICD-10-CM | POA: Diagnosis not present

## 2015-07-21 DIAGNOSIS — I10 Essential (primary) hypertension: Secondary | ICD-10-CM | POA: Diagnosis not present

## 2015-07-21 DIAGNOSIS — M47892 Other spondylosis, cervical region: Secondary | ICD-10-CM | POA: Diagnosis not present

## 2015-07-21 DIAGNOSIS — M25511 Pain in right shoulder: Secondary | ICD-10-CM | POA: Diagnosis not present

## 2015-07-21 DIAGNOSIS — S0990XA Unspecified injury of head, initial encounter: Secondary | ICD-10-CM | POA: Diagnosis not present

## 2015-07-21 DIAGNOSIS — S40011A Contusion of right shoulder, initial encounter: Secondary | ICD-10-CM | POA: Diagnosis not present

## 2015-07-30 DIAGNOSIS — E039 Hypothyroidism, unspecified: Secondary | ICD-10-CM | POA: Diagnosis not present

## 2015-07-30 DIAGNOSIS — I10 Essential (primary) hypertension: Secondary | ICD-10-CM | POA: Diagnosis not present

## 2015-07-30 LAB — BASIC METABOLIC PANEL
BUN: 10 mg/dL (ref 4–21)
Creatinine: 0.6 mg/dL (ref 0.5–1.1)
GLUCOSE: 84 mg/dL
Potassium: 3.7 mmol/L (ref 3.4–5.3)
SODIUM: 131 mmol/L — AB (ref 137–147)

## 2015-07-30 LAB — HEPATIC FUNCTION PANEL
ALT: 15 U/L (ref 7–35)
AST: 25 U/L (ref 13–35)
Alkaline Phosphatase: 44 U/L (ref 25–125)
Bilirubin, Total: 0.8 mg/dL

## 2015-08-02 ENCOUNTER — Encounter: Payer: Self-pay | Admitting: *Deleted

## 2015-08-07 ENCOUNTER — Non-Acute Institutional Stay: Payer: Medicare Other | Admitting: Internal Medicine

## 2015-08-07 ENCOUNTER — Encounter: Payer: Self-pay | Admitting: Internal Medicine

## 2015-08-07 VITALS — BP 136/82 | HR 60 | Temp 97.8°F | Wt 134.0 lb

## 2015-08-07 DIAGNOSIS — R2681 Unsteadiness on feet: Secondary | ICD-10-CM

## 2015-08-07 DIAGNOSIS — F4323 Adjustment disorder with mixed anxiety and depressed mood: Secondary | ICD-10-CM

## 2015-08-07 DIAGNOSIS — S060X0A Concussion without loss of consciousness, initial encounter: Secondary | ICD-10-CM

## 2015-08-07 DIAGNOSIS — S46119A Strain of muscle, fascia and tendon of long head of biceps, unspecified arm, initial encounter: Secondary | ICD-10-CM | POA: Insufficient documentation

## 2015-08-07 DIAGNOSIS — S46211A Strain of muscle, fascia and tendon of other parts of biceps, right arm, initial encounter: Secondary | ICD-10-CM

## 2015-08-07 DIAGNOSIS — S46111A Strain of muscle, fascia and tendon of long head of biceps, right arm, initial encounter: Secondary | ICD-10-CM | POA: Diagnosis not present

## 2015-08-07 DIAGNOSIS — Z66 Do not resuscitate: Secondary | ICD-10-CM

## 2015-08-07 DIAGNOSIS — I10 Essential (primary) hypertension: Secondary | ICD-10-CM | POA: Diagnosis not present

## 2015-08-07 NOTE — Progress Notes (Signed)
Patient ID: Gina Bolton, female   DOB: 1920/10/03, 81 y.o.   MRN: [id number]    [facility]     Place of Service: Clinic (12)     Allergies  Allergen Reactions  . Ibuprofen Nausea Only  . Macrobid [Nitrofurantoin Monohyd Macro]     Swelling and flush   . Sulfa Antibiotics     Chief Complaint  Patient presents with  . Medical Management of Chronic Issues    blood pressure, depression  . Fall    3 weeks ago while in [place]. Was in the dark, tripped, lost balance, went to ER. Had cut above (R) eye brow, hit (R) shoulder. Had x-rays done of head and shoulder.  . Medication Management    not taking the Cardura, blood pressure has return to normal, patient feels she doesn't need it right now.    HPI:   Concussion with no loss of consciousness, initial encounter: Evaluation at [facility] at [place], [place] included CT of the brain and x-rays of the right shoulder. There was no fracture. She says she was told there was no evidence of a stroke or subdural hematoma. She feels that it is taking a long time to get over the fall and injuries. It occurred 3 weeks ago. She still feels dizzy at times.  Biceps tendon rupture, proximal, right, initial encounter: Significant ecchymosis of the right arm anteriorly and laterally. Shortening of the head of the biceps. Muscular lump at midshaft of the humerus.  Essential hypertension: Controlled  Adjustment disorder with mixed anxiety and depressed mood: Stable with no evidence of worsening  Unstable gait: Has been using a cane off and on since her fall. Continues to feel unsteady.    Medications: Patient's Medications  New Prescriptions   No medications on file  Previous Medications   CALCIUM CARB-CHOLECALCIFEROL (CALCIUM + D3) 600-200 MG-UNIT TABS    Take by mouth. Take one tablet twice daily   CHOLECALCIFEROL (VITAMIN D-400 PO)    Take by mouth. Take one daily for vitamin D supplement   CITALOPRAM  (CELEXA) 10 MG TABLET    One daily to help anxiety and depression   CYCLOSPORINE (RESTASIS) 0.05 % OPHTHALMIC EMULSION    1 drop every 12 (twelve) hours. One drop into left eye twice daily   DESONIDE (DESOWEN) 0.05 % CREAM    Apply topically 2 (two) times daily. As needed   DOXAZOSIN (CARDURA) 4 MG TABLET    One daily to help control BP   ESTRADIOL (ESTRACE) 0.1 MG/GM VAGINAL CREAM    Apply 1 gm to vaginal area 3 times a week as directed.   HYDROCHLOROTHIAZIDE (HYDRODIURIL) 25 MG TABLET    TAKE 1 TABLET (25 MG TOTAL) BY MOUTH DAILY. FOR BLOOD PRESSURE   LOSARTAN (COZAAR) 100 MG TABLET    One daily to control BP   MAGNESIUM 400 MG CAPS    Take by mouth. Take one daily for leg cramps   METOPROLOL (LOPRESSOR) 50 MG TABLET    One daily in morning to control blood pressure   METRONIDAZOLE (METROGEL) 0.75 % GEL    Apply topically twice daly to help rash   MULTIPLE VITAMINS-MINERALS ([employer]S SPECTRAVITE PO)    Take by mouth. One daily for eyes   NAPROXEN SODIUM (ALEVE) 220 MG TABLET    Take 220 mg by mouth. Take two tablets daily as needed for pain   RALOXIFENE (EVISTA) 60 MG TABLET    Take 1 tablet (60 mg total) by  mouth daily. To treat osteoporosis   SODIUM CHLORIDE (MURO 128) 5 % OPHTHALMIC SOLUTION    1 drop as needed. One drop in right eye three times daily   VITAMIN C (ASCORBIC ACID) 500 MG TABLET    Take 500 mg by mouth daily.  Modified Medications   No medications on file  Discontinued Medications   AMOXICILLIN (AMOXIL) 500 MG CAPSULE    Take one three times daily for infection     Review of Systems  Constitutional: Negative for fever, chills, diaphoresis, activity change, appetite change, fatigue and unexpected weight change.  HENT: Positive for hearing loss. Negative for congestion, ear pain, mouth sores, rhinorrhea, sinus pressure and sore throat.   Eyes: Positive for visual disturbance (Rx lenses).       Ocular migraine right eye.  Respiratory: Negative for cough, chest tightness,  wheezing and stridor.   Cardiovascular: Negative for chest pain, palpitations and leg swelling.  Gastrointestinal: Negative for nausea, abdominal pain, diarrhea, constipation and abdominal distention.  Endocrine: Negative.  Negative for cold intolerance, heat intolerance and polyuria.  Genitourinary:       Chronic leakage. Has had urologic evaluation.  Musculoskeletal: Positive for back pain, gait problem (unsteady balance) and neck pain. Negative for myalgias, joint swelling, arthralgias and neck stiffness.  Skin: Negative.   Allergic/Immunologic: Negative.   Neurological: Negative for dizziness, tremors, seizures, syncope, facial asymmetry, speech difficulty, weakness, light-headedness, numbness and headaches.       Some balance issues. No falls.  Hematological: Negative.   Psychiatric/Behavioral: Positive for dysphoric mood. The patient is nervous/anxious.        Constant worries about what the future holds for her and her increasingly demented husband.    Filed Vitals:   08/07/15 1117  BP: 136/82  Pulse: 60  Temp: 97.8 F (36.6 C)  TempSrc: Oral  Weight: 134 lb (60.782 kg)  SpO2: 98%   Body mass index is 22.3 kg/(m^2).  Physical Exam  Constitutional: She is oriented to person, place, and time. She appears well-developed and well-nourished. No distress.  HENT:  Head: Normocephalic and atraumatic.  Right Ear: External ear normal.  Left Ear: External ear normal.  Nose: Nose normal.  Mouth/Throat: Oropharynx is clear and moist.  Eyes: Conjunctivae and EOM are normal. Pupils are equal, round, and reactive to light.  Corrective lenses.  Neck: Neck supple. No JVD present. No tracheal deviation present. No thyromegaly present.  Cardiovascular: Normal rate, regular rhythm, normal heart sounds and intact distal pulses.  Exam reveals no gallop and no friction rub.   No murmur heard. Pulmonary/Chest: No respiratory distress. She has no wheezes. She has no rales. She exhibits no  tenderness.  Abdominal: She exhibits no distension and no mass. There is tenderness. There is guarding.  Musculoskeletal: Normal range of motion. She exhibits no edema or tenderness.  Rupture of the head of the biceps laterally  Lymphadenopathy:    She has no cervical adenopathy.  Neurological: She is alert and oriented to person, place, and time. She has normal reflexes. No cranial nerve deficit. Coordination normal.  Skin: No rash noted. No erythema. No pallor.  Ecchymosis of the right arm anteriorly and midshaft laterally. Very tender in the shoulder with rotational movements and with palpation at the proximal end of the Lasix.  Psychiatric: Her behavior is normal. Judgment and thought content normal.  chronic anxiety and mild depression     Labs reviewed: Abstract on 08/02/2015  Component Date Value Ref Range Status  . Glucose 07/30/2015  84   Final  . BUN 07/30/2015 10  4 - 21 mg/dL Final  . Creatinine 07/30/2015 0.6  0.5 - 1.1 mg/dL Final  . Potassium 07/30/2015 3.7  3.4 - 5.3 mmol/L Final  . Sodium 07/30/2015 131* 137 - 147 mmol/L Final  . Alkaline Phosphatase 07/30/2015 44  25 - 125 U/L Final  . ALT 07/30/2015 15  7 - 35 U/L Final  . AST 07/30/2015 25  13 - 35 U/L Final  . Bilirubin, Total 07/30/2015 0.8   Final     Assessment/Plan 1. Concussion with no loss of consciousness, initial encounter Continue to observe. Patient was asked to call us if she had any more "woozy" episodes. Consider at least a 24-hour Holter monitor, EEG, and possibly MRI of the brain if she has any more episodes.  2. Biceps tendon rupture, proximal, right, initial encounter Patient was educated in what it happened to her right arm and the residual tenderness. She was instructed in exercises. I advised her that if she would like physical therapy, she can call if later time.  3. Essential hypertension Controlled  4. Adjustment disorder with mixed anxiety and depressed mood Stable  5. Unstable  gait Advised to continue use cane.  6. Advance directive indicates patient wish for do-not-resuscitate status - DNR (Do Not Resuscitate)

## 2015-08-20 ENCOUNTER — Encounter: Payer: Self-pay | Admitting: Internal Medicine

## 2015-08-28 ENCOUNTER — Other Ambulatory Visit: Payer: Self-pay | Admitting: Internal Medicine

## 2015-09-05 ENCOUNTER — Telehealth: Payer: Self-pay | Admitting: *Deleted

## 2015-09-05 NOTE — Telephone Encounter (Signed)
Dr. Nyoka Cowden received a message in EPIC from Berton Mount at [place] stating: "This is Financial risk analyst at [place] and I was at the health fair at [facility] on Friday. Ms Corro came by my booth and saw the new cpap machine I had on my table. I checked her insurance and she is eligible for a new cpap machine. Can you put an order in epic for a new cpap machine? Also can you put an addendum to the last ov note that states, "pt is compliant on cpap therapy and is in need of a new cpap machine." If you have any questions, please give me a call. Thanks so much!" [place] at [place] # [phone] cell  Dr. Nyoka Cowden addendum his 08/07/2015 OV note and added order.  I called Mandy at [place] [phone] and notified her. She is on EPIC.

## 2015-09-05 NOTE — Telephone Encounter (Signed)
Received form from [place] regarding CPAP settings and certification. Filled out and given to Dr. Nyoka Cowden to review and sign. [facility] #: [phone] Fax:[phone]

## 2015-09-20 DIAGNOSIS — Z23 Encounter for immunization: Secondary | ICD-10-CM | POA: Diagnosis not present

## 2015-09-26 ENCOUNTER — Other Ambulatory Visit: Payer: Self-pay | Admitting: Internal Medicine

## 2015-10-19 ENCOUNTER — Telehealth: Payer: Self-pay

## 2015-10-19 NOTE — Telephone Encounter (Signed)
[place] [facility] nurse called, patient in her office having urgency, just like her last UTI. Patient wants to have urine test done. Redmond Baseman ok, to do urinalysis and urine culture.

## 2015-10-22 DIAGNOSIS — N390 Urinary tract infection, site not specified: Secondary | ICD-10-CM | POA: Diagnosis not present

## 2015-10-24 NOTE — Telephone Encounter (Signed)
Received Urine Specimen Results from [facility] and per Dr. Kenn File patient: No infection. Patient notified and understood.

## 2015-10-25 ENCOUNTER — Other Ambulatory Visit: Payer: Self-pay | Admitting: Internal Medicine

## 2015-11-05 ENCOUNTER — Encounter: Payer: Self-pay | Admitting: Internal Medicine

## 2015-11-06 ENCOUNTER — Other Ambulatory Visit: Payer: Self-pay | Admitting: Internal Medicine

## 2015-11-06 ENCOUNTER — Non-Acute Institutional Stay: Payer: Medicare Other | Admitting: Internal Medicine

## 2015-11-06 ENCOUNTER — Encounter: Payer: Self-pay | Admitting: Internal Medicine

## 2015-11-06 VITALS — BP 140/82 | HR 95 | Temp 97.4°F | Resp 20 | Ht 65.0 in | Wt 135.4 lb

## 2015-11-06 DIAGNOSIS — Z9181 History of falling: Secondary | ICD-10-CM | POA: Diagnosis not present

## 2015-11-06 DIAGNOSIS — F439 Reaction to severe stress, unspecified: Secondary | ICD-10-CM

## 2015-11-06 DIAGNOSIS — Z658 Other specified problems related to psychosocial circumstances: Secondary | ICD-10-CM

## 2015-11-06 DIAGNOSIS — R202 Paresthesia of skin: Secondary | ICD-10-CM | POA: Diagnosis not present

## 2015-11-06 DIAGNOSIS — I10 Essential (primary) hypertension: Secondary | ICD-10-CM | POA: Diagnosis not present

## 2015-11-06 DIAGNOSIS — R2681 Unsteadiness on feet: Secondary | ICD-10-CM

## 2015-11-06 HISTORY — DX: Paresthesia of skin: R20.2

## 2015-11-06 HISTORY — DX: History of falling: Z91.81

## 2015-11-06 NOTE — Progress Notes (Signed)
Patient ID: NANNA ERTLE, female   DOB: 27-Jul-1920, 81 y.o.   MRN: [id number]    [facility], [place]     Place of Service: Clinic (12)     Allergies  Allergen Reactions  . Ibuprofen Nausea Only  . Macrobid [Nitrofurantoin Monohyd Macro]     Swelling and flush   . Sulfa Antibiotics     Chief Complaint  Patient presents with  . Acute Visit    [place] on Sunday evening    HPI:  Patient fell on 11/04/2015 when she was walking. She has had an unstable gait. She is now using a borrowed four-wheel walker with seat and brakes. Her legs feel weak. She has some numbness in the feet and legs. Her toe caught on a vinyl flooring and she went down to the floor. She sustained injuries including a bruise of the lower left jaw, skin tear left arm, and a bruise of the knee. Her rib areas are sore as a result of this fall.  Patient fell about 5 weeks ago and sustained a skin tear of the left lower leg anteriorly. There is been some swelling in this area and perineum wound erythema that worried her. She denies much tenderness and there is no ooze from the area. The wound is dry and there has been no evidence of purulence.  Patient is under significant amount of stress with her husband. He has dementia and is requiring increasing assistance in all ADLs. She would like to obtain help in his bath twice weekly.  In regards to the paresthesias in the legs, she would like to get a neurology appointment.  Medications: Patient's Medications  New Prescriptions   No medications on file  Previous Medications   CALCIUM CARB-CHOLECALCIFEROL (CALCIUM + D3) 600-200 MG-UNIT TABS    Take by mouth. Take one tablet twice daily   CHOLECALCIFEROL (VITAMIN D-400 PO)    Take by mouth. Take one daily for vitamin D supplement   CITALOPRAM (CELEXA) 10 MG TABLET    TAKE 1 TABLET BY MOUTH DAILY TO HELP WITH ANXIETY AND DEPRESSION   CYCLOSPORINE (RESTASIS) 0.05 % OPHTHALMIC EMULSION     1 drop every 12 (twelve) hours. One drop into left eye twice daily   DESONIDE (DESOWEN) 0.05 % CREAM    Apply topically 2 (two) times daily. As needed   ESTRADIOL (ESTRACE) 0.1 MG/GM VAGINAL CREAM    Apply 1 gm to vaginal area 3 times a week as directed.   HYDROCHLOROTHIAZIDE (HYDRODIURIL) 25 MG TABLET    TAKE 1 TABLET (25 MG TOTAL) BY MOUTH DAILY. FOR BLOOD PRESSURE   LOSARTAN (COZAAR) 100 MG TABLET    One daily to control BP   MAGNESIUM 400 MG CAPS    Take by mouth. Take one daily for leg cramps   METOPROLOL (LOPRESSOR) 50 MG TABLET    One daily in morning to control blood pressure   METRONIDAZOLE (METROGEL) 0.75 % GEL    Apply topically twice daly to help rash   MULTIPLE VITAMINS-MINERALS ([employer] SPECTRAVITE PO)    Take by mouth. One daily for eyes   NAPROXEN SODIUM (ALEVE) 220 MG TABLET    Take 220 mg by mouth. Take two tablets daily as needed for pain   RALOXIFENE (EVISTA) 60 MG TABLET    Take 1 tablet (60 mg total) by mouth daily. To treat osteoporosis   SODIUM CHLORIDE (MURO 128) 5 % OPHTHALMIC SOLUTION    1 drop as needed. One  drop in right eye three times daily   VITAMIN C (ASCORBIC ACID) 500 MG TABLET    Take 500 mg by mouth daily.  Modified Medications   No medications on file  Discontinued Medications   DOXAZOSIN (CARDURA) 4 MG TABLET    One daily to help control BP   METOPROLOL (LOPRESSOR) 50 MG TABLET    TAKE 1 TABLET BY MOUTH DAILY FOR BLOOD PRESSURE (TAKE WITH A SNACK)     Review of Systems  Constitutional: Positive for fatigue. Negative for fever, chills, diaphoresis, activity change, appetite change and unexpected weight change.  HENT: Positive for hearing loss. Negative for congestion, ear pain, mouth sores, rhinorrhea, sinus pressure and sore throat.   Eyes: Positive for visual disturbance (Rx lenses).       Ocular migraine right eye.  Respiratory: Negative for cough, chest tightness, wheezing and stridor.   Cardiovascular: Negative for chest pain, palpitations and leg  swelling.  Gastrointestinal: Negative for nausea, abdominal pain, diarrhea, constipation and abdominal distention.  Endocrine: Negative.  Negative for cold intolerance, heat intolerance and polyuria.  Genitourinary:       Chronic leakage. Has had urologic evaluation.  Musculoskeletal: Positive for back pain, gait problem (unsteady balance) and neck pain. Negative for myalgias, joint swelling, arthralgias and neck stiffness.  Skin: Negative.   Allergic/Immunologic: Negative.   Neurological: Positive for weakness. Negative for dizziness, tremors, seizures, syncope, facial asymmetry, speech difficulty, light-headedness, numbness and headaches.       Some balance issues. Patient has had 2 falls in the last 6 weeks. Has noted numbness and some tingling in the feet and lower legs. Fingers and hands seem to be spared. CT scan of the brain in March 2016 showed small vessel disease, but was otherwise normal.  Hematological: Negative.   Psychiatric/Behavioral: Positive for dysphoric mood. The patient is nervous/anxious.        Constant worries about what the future holds for her and her increasingly demented husband.    Filed Vitals:   11/06/15 1102  BP: 140/82  Pulse: 95  Temp: 97.4 F (36.3 C)  TempSrc: Oral  Resp: 20  Height: 5' 5"  (1.651 m)  Weight: 135 lb 6.4 oz (61.417 kg)  SpO2: 94%   Body mass index is 22.53 kg/(m^2).  Physical Exam  Constitutional: She is oriented to person, place, and time. She appears well-developed and well-nourished. No distress.  HENT:  Head: Normocephalic and atraumatic.  Right Ear: External ear normal.  Left Ear: External ear normal.  Nose: Nose normal.  Mouth/Throat: Oropharynx is clear and moist.  Eyes: Conjunctivae and EOM are normal. Pupils are equal, round, and reactive to light.  Corrective lenses.  Neck: Neck supple. No JVD present. No tracheal deviation present. No thyromegaly present.  Cardiovascular: Normal rate, regular rhythm, normal heart  sounds and intact distal pulses.  Exam reveals no gallop and no friction rub.   No murmur heard. Pulmonary/Chest: No respiratory distress. She has no wheezes. She has no rales. She exhibits no tenderness.  Abdominal: She exhibits no distension and no mass. There is tenderness. There is guarding.  Musculoskeletal: Normal range of motion. She exhibits no edema or tenderness.  Hx of Rupture of the head of the right biceps laterally  Lymphadenopathy:    She has no cervical adenopathy.  Neurological: She is alert and oriented to person, place, and time. She has normal reflexes. No cranial nerve deficit. Coordination normal.  Skin: No rash noted. No erythema. No pallor.  Skin tear anteriorly in  the left mid shaft of the tibia. Periwound erythema. No purulence.  Psychiatric: Her behavior is normal. Judgment and thought content normal.  chronic anxiety and mild depression     Labs reviewed: Lab Summary Latest Ref Rng 07/30/2015 09/07/2014  Hemoglobin - (None) (None)  Hematocrit - (None) (None)  White count - (None) (None)  Platelet count - (None) (None)  Sodium 137 - 147 mmol/L 131(A) 134(A)  Potassium 3.4 - 5.3 mmol/L 3.7 3.7  Calcium - (None) (None)  Phosphorus - (None) (None)  Creatinine 0.5 - 1.1 mg/dL 0.6 0.7  AST 13 - 35 U/L 25 29  Alk Phos 25 - 125 U/L 44 42  Bilirubin - (None) (None)  Glucose - 84 94  Cholesterol - (None) (None)  HDL cholesterol - (None) (None)  Triglycerides - (None) (None)  LDL Direct - (None) (None)  LDL Calc - (None) (None)  Total protein - (None) (None)  Albumin - (None) (None)   No results found for: TSH, T3TOTAL, T4TOTAL, THYROIDAB Lab Results  Component Value Date   BUN 10 07/30/2015   No results found for: HGBA1C     Assessment/Plan 1. History of fall Complicated by unstable gait and paresthesias. Patient desires a four-wheel rolling walker.  2. Unstable gait Start using four-wheel rolling walker with brakes and seat - Ambulatory referral  to Neurology  3. Paresthesia -B12 level, sedimentation rate, TSH, CMP, CBC - Ambulatory referral to Neurology  4. Stress Prescription written for showers twice weekly for her husband  5. Essential hypertension Controlled  6. Wound left shin I believe this is healing well and is not infected. Continue to cleanse daily.

## 2015-11-20 DIAGNOSIS — Z23 Encounter for immunization: Secondary | ICD-10-CM | POA: Diagnosis not present

## 2015-11-29 ENCOUNTER — Ambulatory Visit (INDEPENDENT_AMBULATORY_CARE_PROVIDER_SITE_OTHER): Payer: Medicare Other | Admitting: Neurology

## 2015-11-29 ENCOUNTER — Encounter: Payer: Self-pay | Admitting: Neurology

## 2015-11-29 ENCOUNTER — Encounter: Payer: Self-pay | Admitting: *Deleted

## 2015-11-29 VITALS — BP 144/82 | HR 64 | Ht 65.0 in | Wt 136.5 lb

## 2015-11-29 DIAGNOSIS — Z9181 History of falling: Secondary | ICD-10-CM | POA: Diagnosis not present

## 2015-11-29 DIAGNOSIS — E538 Deficiency of other specified B group vitamins: Secondary | ICD-10-CM

## 2015-11-29 DIAGNOSIS — R413 Other amnesia: Secondary | ICD-10-CM | POA: Diagnosis not present

## 2015-11-29 DIAGNOSIS — G609 Hereditary and idiopathic neuropathy, unspecified: Secondary | ICD-10-CM | POA: Diagnosis not present

## 2015-11-29 DIAGNOSIS — R202 Paresthesia of skin: Secondary | ICD-10-CM

## 2015-11-29 DIAGNOSIS — R2681 Unsteadiness on feet: Secondary | ICD-10-CM

## 2015-11-29 DIAGNOSIS — D649 Anemia, unspecified: Secondary | ICD-10-CM | POA: Diagnosis not present

## 2015-11-29 LAB — CBC AND DIFFERENTIAL
HCT: 38 % (ref 36–46)
Hemoglobin: 12.5 g/dL (ref 12.0–16.0)
Platelets: 193 10*3/uL (ref 150–399)
WBC: 5.6 10^3/mL

## 2015-11-29 LAB — BASIC METABOLIC PANEL
BUN: 11 mg/dL (ref 4–21)
CREATININE: 0.6 mg/dL (ref 0.5–1.1)
Glucose: 96 mg/dL
POTASSIUM: 3.8 mmol/L (ref 3.4–5.3)
SODIUM: 135 mmol/L — AB (ref 137–147)

## 2015-11-29 LAB — HEPATIC FUNCTION PANEL
ALK PHOS: 91 U/L (ref 25–125)
ALT: 25 U/L (ref 7–35)
AST: 34 U/L (ref 13–35)
Bilirubin, Total: 0.6 mg/dL

## 2015-11-29 LAB — TSH: TSH: 4 u[IU]/mL (ref 0.41–5.90)

## 2015-11-29 NOTE — Progress Notes (Signed)
Reason for visit: Gait disorder  Referring physician: Dr. Shelva Majestic Gina Bolton is a 81 y.o. female  History of present illness:  Gina Bolton is a 81 year old right-handed white female with a history of numbness in the feet that dates back at least 10 years. The patient has had gradual worsening of the numbness, she denies significant numbness of the hands. For greater than one year she has had some gradually worsening problems with gait, she has fallen at least 2 times within the last 2 months. The last fall was on 11/04/2015. The patient has had minor injuries with the falls. She is now using a cane for ambulation, occasionally she will use a walker. She underwent a CT scan of the brain and cervical spine on 07/21/2015, the written reports indicate some degree of small vessel disease on this CT, no acute changes were seen. CT of the cervical spine showed some spondylitic changes at the C5-6 and C6-7 levels, no mention of severe spinal stenosis was made. The patient does have some urinary incontinence issues, but no problems controlling the bowels. He denies any dizziness when upright and walking. She indicates that she has difficulty when she tries to walk in a dimly lit room. She has noticed that if she reaches out and touches something, the sensation of imbalance significantly improves. The patient reports numbness up to the knees bilaterally. She denies any burning or stinging sensations in the feet. She is sent to this office for an evaluation.  Past Medical History  Diagnosis Date  . Dysphonia 01/25/2013  . Mild cognitive impairment, so stated 12/02/2011  . Chronic rhinitis 12/02/2011  . Urgency of urination 08/26/2011  . Edema 10/09/2010  . Spinal stenosis in cervical region 09/10/2010  . Closed fracture of second cervical vertebra without mention of spinal cord injury 09/10/2010  . Urinary frequency 07/30/2010  . Closed fracture of unspecified part of ramus of mandible 06/11/2010  .  Cervicalgia 11/20/2009  . Shortness of breath 11/20/2009  . Spinal stenosis, lumbar region, without neurogenic claudication 08/13/2009  . Unspecified hereditary and idiopathic peripheral neuropathy 05/08/2009  . Rosacea 10/24/2008  . Hemangioma of other sites 9//2008  . Other sequelae of chronic liver disease 08/31/2007  . Acquired cyst of kidney 9///2008  . Unspecified constipation 02/23/2007  . Neurogenic bladder, NOS 02/23/2007  . Obstructive sleep apnea (adult) (pediatric) 04/21/2006  . Abnormality of gait 11/11/2005  . Disturbance of skin sensation 11/11/2005  . Unspecified essential hypertension 08/02/2002  . Unspecified hypothyroidism 04/19/2001  . Other and unspecified hyperlipidemia 03/09/2001  . Osteoarthrosis, unspecified whether generalized or localized, unspecified site 03/09/2001  . Senile osteoporosis 03/09/2001  . Hereditary spherocytosis ([place]) 01/14/2000  . Allergic rhinitis, cause unspecified 01/14/2000  . Cramp of limb 01/14/2000  . Personal history of malignant neoplasm of breast 01/14/2000  . Malignant neoplasm of breast (female), unspecified site 05/24/1997    left  . Disorder of bone and cartilage, unspecified 05/04/1995  . Cloudy urine   . History of fall 11/06/2015    11/04/15  . Paresthesia 11/06/2015    Past Surgical History  Procedure Laterality Date  . Tonsillectomy    . Abdominal hysterectomy  1974  . Breast lumpectomy Left 1998    Dr. Coletta Memos  . Bladder repair    . Cataract extraction w/ intraocular lens implant  07/1999    Dr. Bing Plume  . Corneal transplant Left 2002  . Colonoscopy  02/28/1999    no polyps small internal hemorrhoid noted  Family History  Problem Relation Age of Onset  . Cancer Father     lung  . Cancer Sister     breast     Social history:  reports that she has never smoked. She has never used smokeless tobacco. She reports that she drinks about 4.2 oz of alcohol per week. She reports that she does not use illicit drugs.  Medications:    Prior to Admission medications   Medication Sig Start Date End Date Taking? Authorizing Provider  Calcium Carb-Cholecalciferol (CALCIUM + D3) 600-200 MG-UNIT TABS Take by mouth. Take one tablet twice daily   Yes Historical Provider, MD  Cholecalciferol (VITAMIN D-400 PO) Take by mouth. Take one daily for vitamin D supplement   Yes Historical Provider, MD  citalopram (CELEXA) 10 MG tablet TAKE 1 TABLET BY MOUTH DAILY TO HELP WITH ANXIETY AND DEPRESSION 09/26/15  Yes Estill Dooms, MD  cycloSPORINE (RESTASIS) 0.05 % ophthalmic emulsion 1 drop every 12 (twelve) hours. One drop into left eye twice daily   Yes Historical Provider, MD  desonide (DESOWEN) 0.05 % cream Apply topically 2 (two) times daily. As needed   Yes Historical Provider, MD  estradiol (ESTRACE) 0.1 MG/GM vaginal cream Apply 1 gm to vaginal area 3 times a week as directed. 02/13/15  Yes Estill Dooms, MD  hydrochlorothiazide (HYDRODIURIL) 25 MG tablet TAKE 1 TABLET (25 MG TOTAL) BY MOUTH DAILY. FOR BLOOD PRESSURE 11/06/15  Yes Estill Dooms, MD  losartan (COZAAR) 100 MG tablet One daily to control BP 05/03/15  Yes Estill Dooms, MD  Magnesium 400 MG CAPS Take by mouth. Take one daily for leg cramps   Yes Historical Provider, MD  metoprolol (LOPRESSOR) 50 MG tablet One daily in morning to control blood pressure 01/30/15  Yes Estill Dooms, MD  metroNIDAZOLE (METROGEL) 0.75 % gel Apply topically twice daly to help rash 05/01/15  Yes Estill Dooms, MD  Multiple Vitamins-Minerals (CVS SPECTRAVITE PO) Take by mouth. One daily for eyes   Yes Historical Provider, MD  naproxen sodium (ALEVE) 220 MG tablet Take 220 mg by mouth. Take two tablets daily as needed for pain   Yes Historical Provider, MD  raloxifene (EVISTA) 60 MG tablet Take 1 tablet (60 mg total) by mouth daily. To treat osteoporosis 05/15/15  Yes Estill Dooms, MD  sodium chloride (MURO 128) 5 % ophthalmic solution Place 1 drop into the right eye 2 (two) times daily.    Yes  Historical Provider, MD  vitamin C (ASCORBIC ACID) 500 MG tablet Take 500 mg by mouth daily.   Yes Historical Provider, MD      Allergies  Allergen Reactions  . Ibuprofen Nausea Only  . Macrobid [Nitrofurantoin Monohyd Macro]     Swelling and flush   . Sulfa Antibiotics     ROS:  Out of a complete 14 system review of symptoms, the patient complains only of the following symptoms, and all other reviewed systems are negative.  Fatigue Hearing loss Decreased vision Urinary incontinence Easy bruising Muscle cramps Memory loss Sleepiness  Blood pressure 144/82, pulse 64, height 5\' 5"  (1.651 m), weight 136 lb 8 oz (61.916 kg).  Physical Exam  General: The patient is alert and cooperative at the time of the examination.  Eyes: Pupils are equal, round, and reactive to light. Discs are flat bilaterally.  Neck: The neck is supple, no carotid bruits are noted.  Respiratory: The respiratory examination is clear.  Cardiovascular: The cardiovascular examination reveals a  regular rate and rhythm, no obvious murmurs or rubs are noted.  Skin: Extremities are with 1+ edema of the left distal leg and ankle, evidence of a healing injury to the pretibial area noted. No edema is noted on the right leg.  Neurologic Exam  Mental status: The patient is alert and oriented x 3 at the time of the examination. The patient has apparent normal recent and remote memory, with an apparently normal attention span and concentration ability.  Cranial nerves: Facial symmetry is present. There is good sensation of the face to pinprick and soft touch bilaterally. The strength of the facial muscles and the muscles to head turning and shoulder shrug are normal bilaterally. Speech is well enunciated, no aphasia or dysarthria is noted. Extraocular movements are full. Visual fields are full. The tongue is midline, and the patient has symmetric elevation of the soft palate. No obvious hearing deficits are  noted.  Motor: The motor testing reveals 5 over 5 strength of all 4 extremities. Good symmetric motor tone is noted throughout.  Sensory: Sensory testing is intact to pinprick, soft touch, vibration sensation, and position sense on all 4 extremities, with exception of a stocking pattern pinprick sensory deficit up to the knees bilaterally with evidence of some decrease in vibration sensation on the left greater than right foot. No evidence of extinction is noted.  Coordination: Cerebellar testing reveals good finger-nose-finger and heel-to-shin bilaterally.  Gait and station: Gait is slightly wide-based, the patient normally walks with a cane. The patient has good stride, some slight instability with turns. Tandem gait is unsteady. Romberg is negative but is unsteady.  Reflexes: Deep tendon reflexes are symmetric, but are depressed bilaterally. Toes are downgoing bilaterally.   Assessment/Plan:  1. Gait disturbance  2. Lower extremity numbness, probable peripheral neuropathy  3. Advanced age  The patient appears to have a gait disturbance that is associated with decreased sensation in the legs and feet. The patient likely has a peripheral neuropathy. We will pursue blood work today, check nerve conduction studies of both legs, and EMG of one leg. I have discussed getting her into physical therapy for gait training, she does not wish to pursue this at this time as she is caring for her demented husband, and does not want to take time away from him. She will follow-up for the EMG evaluation.  Jill Alexanders MD 11/29/2015 7:16 PM  [facility] [address] [place], [place] [address]  Phone [phone] Fax [phone]

## 2015-11-29 NOTE — Patient Instructions (Signed)
Fall Prevention in the Home  Falls can cause injuries and can affect people from all age groups. There are many simple things that you can do to make your home safe and to help prevent falls. WHAT CAN I DO ON THE OUTSIDE OF MY HOME?  Regularly repair the edges of walkways and driveways and fix any cracks.  Remove high doorway thresholds.  Trim any shrubbery on the main path into your home.  Use bright outdoor lighting.  Clear walkways of debris and clutter, including tools and rocks.  Regularly check that handrails are securely fastened and in good repair. Both sides of any steps should have handrails.  Install guardrails along the edges of any raised decks or porches.  Have leaves, snow, and ice cleared regularly.  Use sand or salt on walkways during winter months.  In the garage, clean up any spills right away, including grease or oil spills. WHAT CAN I DO IN THE BATHROOM?  Use night lights.  Install grab bars by the toilet and in the tub and shower. Do not use towel bars as grab bars.  Use non-skid mats or decals on the floor of the tub or shower.  If you need to sit down while you are in the shower, use a plastic, non-slip stool..  Keep the floor dry. Immediately clean up any water that spills on the floor.  Remove soap buildup in the tub or shower on a regular basis.  Attach bath mats securely with double-sided non-slip rug tape.  Remove throw rugs and other tripping hazards from the floor. WHAT CAN I DO IN THE BEDROOM?  Use night lights.  Make sure that a bedside light is easy to reach.  Do not use oversized bedding that drapes onto the floor.  Have a firm chair that has side arms to use for getting dressed.  Remove throw rugs and other tripping hazards from the floor. WHAT CAN I DO IN THE KITCHEN?   Clean up any spills right away.  Avoid walking on wet floors.  Place frequently used items in easy-to-reach places.  If you need to reach for something  above you, use a sturdy step stool that has a grab bar.  Keep electrical cables out of the way.  Do not use floor polish or wax that makes floors slippery. If you have to use wax, make sure that it is non-skid floor wax.  Remove throw rugs and other tripping hazards from the floor. WHAT CAN I DO IN THE STAIRWAYS?  Do not leave any items on the stairs.  Make sure that there are handrails on both sides of the stairs. Fix handrails that are broken or loose. Make sure that handrails are as long as the stairways.  Check any carpeting to make sure that it is firmly attached to the stairs. Fix any carpet that is loose or worn.  Avoid having throw rugs at the top or bottom of stairways, or secure the rugs with carpet tape to prevent them from moving.  Make sure that you have a light switch at the top of the stairs and the bottom of the stairs. If you do not have them, have them installed. WHAT ARE SOME OTHER FALL PREVENTION TIPS?  Wear closed-toe shoes that fit well and support your feet. Wear shoes that have rubber soles or low heels.  When you use a stepladder, make sure that it is completely opened and that the sides are firmly locked. Have someone hold the ladder while you   are using it. Do not climb a closed stepladder.  Add color or contrast paint or tape to grab bars and handrails in your home. Place contrasting color strips on the first and last steps.  Use mobility aids as needed, such as canes, walkers, scooters, and crutches.  Turn on lights if it is dark. Replace any light bulbs that burn out.  Set up furniture so that there are clear paths. Keep the furniture in the same spot.  Fix any uneven floor surfaces.  Choose a carpet design that does not hide the edge of steps of a stairway.  Be aware of any and all pets.  Review your medicines with your healthcare provider. Some medicines can cause dizziness or changes in blood pressure, which increase your risk of falling. Talk  with your health care provider about other ways that you can decrease your risk of falls. This may include working with a physical therapist or trainer to improve your strength, balance, and endurance.   This information is not intended to replace advice given to you by your health care provider. Make sure you discuss any questions you have with your health care provider.   Document Released: 11/28/2002 Document Revised: 04/24/2015 Document Reviewed: 01/12/2015 Elsevier Interactive Patient Education 2016 [employer]  

## 2015-12-03 LAB — MULTIPLE MYELOMA PANEL, SERUM
ALBUMIN/GLOB SERPL: 1.1 (ref 0.7–1.7)
Albumin SerPl Elph-Mcnc: 3.5 g/dL (ref 2.9–4.4)
Alpha 1: 0.3 g/dL (ref 0.0–0.4)
Alpha2 Glob SerPl Elph-Mcnc: 0.6 g/dL (ref 0.4–1.0)
B-GLOBULIN SERPL ELPH-MCNC: 0.9 g/dL (ref 0.7–1.3)
Gamma Glob SerPl Elph-Mcnc: 1.7 g/dL (ref 0.4–1.8)
Globulin, Total: 3.4 g/dL (ref 2.2–3.9)
IgA/Immunoglobulin A, Serum: 83 mg/dL (ref 64–422)
IgG (Immunoglobin G), Serum: 1671 mg/dL — ABNORMAL HIGH (ref 700–1600)
IgM (Immunoglobulin M), Srm: 185 mg/dL (ref 26–217)
TOTAL PROTEIN: 6.9 g/dL (ref 6.0–8.5)

## 2015-12-03 LAB — COPPER, SERUM: COPPER: 130 ug/dL (ref 72–166)

## 2015-12-03 LAB — RHEUMATOID FACTOR: RHEUMATOID FACTOR: 12.9 [IU]/mL (ref 0.0–13.9)

## 2015-12-03 LAB — ANA W/REFLEX: ANA: NEGATIVE

## 2015-12-03 LAB — ANGIOTENSIN CONVERTING ENZYME: Angio Convert Enzyme: 34 U/L (ref 14–82)

## 2015-12-03 LAB — VITAMIN B12: Vitamin B-12: 576 pg/mL (ref 211–946)

## 2015-12-04 ENCOUNTER — Encounter: Payer: Self-pay | Admitting: Internal Medicine

## 2015-12-05 DIAGNOSIS — Z947 Corneal transplant status: Secondary | ICD-10-CM | POA: Diagnosis not present

## 2015-12-05 DIAGNOSIS — H1851 Endothelial corneal dystrophy: Secondary | ICD-10-CM | POA: Diagnosis not present

## 2015-12-07 ENCOUNTER — Other Ambulatory Visit: Payer: Self-pay | Admitting: Internal Medicine

## 2015-12-18 ENCOUNTER — Non-Acute Institutional Stay: Payer: Medicare Other | Admitting: Internal Medicine

## 2015-12-18 ENCOUNTER — Encounter: Payer: Self-pay | Admitting: Internal Medicine

## 2015-12-18 VITALS — BP 150/80 | HR 57 | Temp 98.0°F | Resp 20 | Ht 65.0 in | Wt 134.8 lb

## 2015-12-18 DIAGNOSIS — R609 Edema, unspecified: Secondary | ICD-10-CM

## 2015-12-18 DIAGNOSIS — I10 Essential (primary) hypertension: Secondary | ICD-10-CM | POA: Diagnosis not present

## 2015-12-18 DIAGNOSIS — E039 Hypothyroidism, unspecified: Secondary | ICD-10-CM | POA: Diagnosis not present

## 2015-12-18 DIAGNOSIS — S81802D Unspecified open wound, left lower leg, subsequent encounter: Secondary | ICD-10-CM | POA: Diagnosis not present

## 2015-12-18 DIAGNOSIS — R413 Other amnesia: Secondary | ICD-10-CM

## 2015-12-18 DIAGNOSIS — S81819A Laceration without foreign body, unspecified lower leg, initial encounter: Secondary | ICD-10-CM | POA: Insufficient documentation

## 2015-12-18 NOTE — Progress Notes (Signed)
Patient ID: Gina Bolton, female   DOB: [birth year], 81 y.o.   MRN: [id number]    [facility]     Place of Service: Clinic (12)     Allergies  Allergen Reactions  . Ibuprofen Nausea Only  . Macrobid [Nitrofurantoin Monohyd Macro]     Swelling and flush   . Sulfa Antibiotics     Chief Complaint  Patient presents with  . Medical Management of Chronic Issues    4 mo f/u    HPI:  Patient saw Dr. Jannifer Franklin, neurologist, on 11/29/2015. His office notes indicate that he noted a gait disturbance associated decreased sensation in the legs and possible peripheral neuropathy. She is to have PNCV and EMG. He discussed physical therapy, but she did not wish to pursue.  I last saw this patient for an acute visit 11/04/2015. She was noted to have an unstable gait at that time and had fallen. She said her toe caught on a vinyl flooring. She sustained injuries including a bruise of the lower left jaw, skin tear of the left arm, and a bruise of the knee. Rib areas were also sore. [place]   Memory disturbance - she continues to be quite concerned about her memory. I'm hoping that this is more related to stress to memory failure.  Essential hypertension - controlled  Hypothyroidism, unspecified hypothyroidism type - compensated  Edema, unspecified type - unchanged  Wound of left leg, subsequent encounter - scabbed over wound of the anterior left shin that is been present 2 months. Surrounded by some erythema and mild tenderness with pressure in the area. There is no purulent drainage.    Medications: Patient's Medications  New Prescriptions   No medications on file  Previous Medications   CALCIUM CARB-CHOLECALCIFEROL (CALCIUM + D3) 600-200 MG-UNIT TABS    Take by mouth. Take one tablet twice daily   CHOLECALCIFEROL (VITAMIN D-400 PO)    Take by mouth. Take one daily for vitamin D supplement   CITALOPRAM (CELEXA) 10 MG  TABLET    TAKE 1 TABLET BY MOUTH DAILY TO HELP WITH ANXIETY AND DEPRESSION   CYCLOSPORINE (RESTASIS) 0.05 % OPHTHALMIC EMULSION    1 drop every 12 (twelve) hours. One drop into left eye twice daily   DESONIDE (DESOWEN) 0.05 % CREAM    Apply topically 2 (two) times daily. As needed   ESTRADIOL (ESTRACE) 0.1 MG/GM VAGINAL CREAM    Apply 1 gm to vaginal area 3 times a week as directed.   HYDROCHLOROTHIAZIDE (HYDRODIURIL) 25 MG TABLET    TAKE 1 TABLET (25 MG TOTAL) BY MOUTH DAILY. FOR BLOOD PRESSURE   LOSARTAN (COZAAR) 100 MG TABLET    One daily to control BP   MAGNESIUM 400 MG CAPS    Take by mouth. Take one tablet twice daily for leg cramps   METOPROLOL (LOPRESSOR) 50 MG TABLET    One daily in morning to control blood pressure   METRONIDAZOLE (METROGEL) 0.75 % GEL    Apply topically twice daly to help rash   MULTIPLE VITAMINS-MINERALS ([employer] SPECTRAVITE PO)    Take by mouth. One daily for eyes   NAPROXEN SODIUM (ALEVE) 220 MG TABLET    Take 220 mg by mouth. Take two tablets daily as needed for pain   RALOXIFENE (EVISTA) 60 MG TABLET    Take 1 tablet (60 mg total) by mouth daily. To treat osteoporosis   SODIUM CHLORIDE (MURO 128) 5 % OPHTHALMIC  SOLUTION    Place 1 drop into the right eye 2 (two) times daily.    VITAMIN C (ASCORBIC ACID) 500 MG TABLET    Take 500 mg by mouth daily.  Modified Medications   No medications on file  Discontinued Medications   LOSARTAN (COZAAR) 100 MG TABLET    TAKE ONE TABLET BY MOUTH  DAILY TO LOWER BLOOD PRESSURE  (NEW DOSE)     Review of Systems  Constitutional: Positive for fatigue. Negative for fever, chills, diaphoresis, activity change, appetite change and unexpected weight change.  HENT: Positive for hearing loss. Negative for congestion, ear pain, mouth sores, rhinorrhea, sinus pressure and sore throat.   Eyes: Positive for visual disturbance (Rx lenses).       Ocular migraine right eye.  Respiratory: Negative for cough, chest tightness, wheezing and  stridor.   Cardiovascular: Negative for chest pain, palpitations and leg swelling.  Gastrointestinal: Negative for nausea, abdominal pain, diarrhea, constipation and abdominal distention.  Endocrine: Negative.  Negative for cold intolerance, heat intolerance and polyuria.  Genitourinary:       Chronic leakage. Has had urologic evaluation.  Musculoskeletal: Positive for back pain, gait problem (unsteady balance) and neck pain. Negative for myalgias, joint swelling, arthralgias and neck stiffness.  Skin:       Left anterior shin has a wond present since October 2016. Started as a skin tear . Scaling around the wound and periwound erythema. No purulence.  Allergic/Immunologic: Negative.   Neurological: Positive for weakness. Negative for dizziness, tremors, seizures, syncope, facial asymmetry, speech difficulty, light-headedness, numbness and headaches.       Some balance issues. Patient has had 2 falls in the last 6 weeks. Has noted numbness and some tingling in the feet and lower legs. Fingers and hands seem to be spared. CT scan of the brain in March 2016 showed small vessel disease, but was otherwise normal.  Hematological: Negative.   Psychiatric/Behavioral: Positive for dysphoric mood. The patient is nervous/anxious.        Constant worries about what the future holds for her and her increasingly demented husband.    Filed Vitals:   12/18/15 1059  BP: 150/80  Pulse: 57  Temp: 98 F (36.7 C)  TempSrc: Oral  Resp: 20  Height: 5' 5"  (1.651 m)  Weight: 134 lb 12.8 oz (61.145 kg)  SpO2: 96%   Wt Readings from Last 3 Encounters:  12/18/15 134 lb 12.8 oz (61.145 kg)  11/29/15 136 lb 8 oz (61.916 kg)  11/06/15 135 lb 6.4 oz (61.417 kg)    Body mass index is 22.43 kg/(m^2).  Physical Exam  Constitutional: She is oriented to person, place, and time. She appears well-developed and well-nourished. No distress.  HENT:  Head: Normocephalic and atraumatic.  Right Ear: External ear  normal.  Left Ear: External ear normal.  Nose: Nose normal.  Mouth/Throat: Oropharynx is clear and moist.  Eyes: Conjunctivae and EOM are normal. Pupils are equal, round, and reactive to light.  Corrective lenses.  Neck: Neck supple. No JVD present. No tracheal deviation present. No thyromegaly present.  Cardiovascular: Normal rate, regular rhythm, normal heart sounds and intact distal pulses.  Exam reveals no gallop and no friction rub.   No murmur heard. Pulmonary/Chest: No respiratory distress. She has no wheezes. She has no rales. She exhibits no tenderness.  Abdominal: She exhibits no distension and no mass. There is tenderness. There is guarding.  Musculoskeletal: Normal range of motion. She exhibits no edema or tenderness.  Hx  of Rupture of the head of the right biceps laterally  Lymphadenopathy:    She has no cervical adenopathy.  Neurological: She is alert and oriented to person, place, and time. She has normal reflexes. No cranial nerve deficit. Coordination normal.  Skin: No rash noted. No erythema. No pallor.  Since Oct 2016, has had persistent wound anteriorly in the left mid shaft of the tibia. Periwound erythema. No purulence. Scaling at wound.  Psychiatric: Her behavior is normal. Judgment and thought content normal.  chronic anxiety and mild depression     Labs reviewed: Lab Summary Latest Ref Rng 11/29/2015 07/30/2015 09/07/2014  Hemoglobin 12.0 - 16.0 g/dL 12.5 (None) (None)  Hematocrit 36 - 46 % 38 (None) (None)  White count - 5.6 (None) (None)  Platelet count 150 - 399 K/L 193 (None) (None)  Sodium 137 - 147 mmol/L 135(A) 131(A) 134(A)  Potassium 3.4 - 5.3 mmol/L 3.8 3.7 3.7  Calcium - (None) (None) (None)  Phosphorus - (None) (None) (None)  Creatinine 0.5 - 1.1 mg/dL 0.6 0.6 0.7  AST 13 - 35 U/L 34 25 29  Alk Phos 25 - 125 U/L 91 44 42  Bilirubin - (None) (None) (None)  Glucose - 96 84 94  Cholesterol - (None) (None) (None)  HDL cholesterol - (None) (None)  (None)  Triglycerides - (None) (None) (None)  LDL Direct - (None) (None) (None)  LDL Calc - (None) (None) (None)  Total protein - (None) (None) (None)  Albumin - (None) (None) (None)   Lab Results  Component Value Date   TSH 4.00 11/29/2015   Lab Results  Component Value Date   BUN 11 11/29/2015   No results found for: HGBA1C     Assessment/Plan  1. Memory disturbance MMSE next visit  2. Essential hypertension Controlled  3. Hypothyroidism, unspecified hypothyroidism type Compensated  4. Edema, unspecified type Stable  5. Wound of left leg, subsequent encounter Apply triple antibiotic ointment to scab and wound daily  Patient will be getting a PNCV January 2017 by Dr. Jannifer Franklin.

## 2015-12-25 ENCOUNTER — Other Ambulatory Visit: Payer: Self-pay | Admitting: *Deleted

## 2015-12-25 MED ORDER — RALOXIFENE HCL 60 MG PO TABS: 60.0000 mg | ORAL_TABLET | Freq: Every day | ORAL | Status: DC

## 2015-12-25 NOTE — Telephone Encounter (Signed)
[facility] Rx Form from Jeri Modena [id number] Fax: [phone] Given to Dr. Nyoka Cowden to review and sign.

## 2016-01-03 DIAGNOSIS — I890 Lymphedema, not elsewhere classified: Secondary | ICD-10-CM | POA: Diagnosis not present

## 2016-01-03 DIAGNOSIS — L97221 Non-pressure chronic ulcer of left calf limited to breakdown of skin: Secondary | ICD-10-CM | POA: Insufficient documentation

## 2016-01-11 ENCOUNTER — Ambulatory Visit (INDEPENDENT_AMBULATORY_CARE_PROVIDER_SITE_OTHER): Payer: Self-pay | Admitting: Neurology

## 2016-01-11 ENCOUNTER — Encounter: Payer: Self-pay | Admitting: Internal Medicine

## 2016-01-11 ENCOUNTER — Encounter: Payer: Self-pay | Admitting: Neurology

## 2016-01-11 ENCOUNTER — Ambulatory Visit (INDEPENDENT_AMBULATORY_CARE_PROVIDER_SITE_OTHER): Payer: Medicare Other | Admitting: Neurology

## 2016-01-11 DIAGNOSIS — R202 Paresthesia of skin: Secondary | ICD-10-CM

## 2016-01-11 DIAGNOSIS — R2681 Unsteadiness on feet: Secondary | ICD-10-CM

## 2016-01-11 DIAGNOSIS — G609 Hereditary and idiopathic neuropathy, unspecified: Secondary | ICD-10-CM

## 2016-01-11 DIAGNOSIS — R413 Other amnesia: Secondary | ICD-10-CM

## 2016-01-11 DIAGNOSIS — G5601 Carpal tunnel syndrome, right upper limb: Secondary | ICD-10-CM

## 2016-01-11 DIAGNOSIS — Z0289 Encounter for other administrative examinations: Secondary | ICD-10-CM

## 2016-01-11 DIAGNOSIS — G5600 Carpal tunnel syndrome, unspecified upper limb: Secondary | ICD-10-CM

## 2016-01-11 HISTORY — DX: Carpal tunnel syndrome, unspecified upper limb: G56.00

## 2016-01-11 NOTE — Progress Notes (Signed)
Please refer to EMG and nerve conduction study evaluation note. 

## 2016-01-11 NOTE — Progress Notes (Signed)
Ms. Kollmann comes in today for EMG and nerve conduction study evaluation. She has had a recent fall about 2 weeks ago , landing on her right side. The patient has recurrent from this fall. The patient reports some low back pain, no radiation down the legs noted. 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 There may be some suggestion by EMG of an overlying L5 or S1 radiculopathy.    The patient likely has a gait instability in part related to the peripheral neuropathy. I have recommended physical therapy for balance training. We will get this set up. She will follow-up in 4 months.

## 2016-01-11 NOTE — Procedures (Signed)
     HISTORY:   Maeli Mueck is a 82 year old patient with a history of a progressive gait instability issue. The patient has lack of sensation in the feet. She is being evaluated for a possible peripheral neuropathy. The patient has had a recent fall within the last 2 weeks. The patient does report some back pain without radiation into the legs.  NERVE CONDUCTION STUDIES:  Nerve conduction studies were performed on the right upper extremity. The distal motor latency for the right median nerve was prolonged, with a borderline motor amplitude. The distal motor latency and motor amplitude for the right ulnar nerve was normal. The F wave latency for the right median nerve was borderline normal, normal for the ulnar nerve, with normal nerve conduction velocities seen for the median and ulnar nerves. The sensory latencies for the median nerve was prolonged, normal for the ulnar nerve.  Nerve conduction studies were performed on both lower extremities. The study of the right peroneal nerve was unobtainable. The distal motor latency for the left peroneal nerve was prolonged with a low motor amplitude. The distal motor latencies for the posterior tibial nerves were normal, with low motor amplitudes for these nerves bilaterally. Nerve conduction velocities for the left peroneal nerve and for the posterior tibial nerves bilaterally were normal. The sensory latencies for the peroneal nerves were absent bilaterally. The H reflex latencies were absent bilaterally.  EMG STUDIES:  EMG study was performed on the right lower extremity:  The tibialis anterior muscle reveals 2 to 6K motor units with decreased recruitment.  3+ fibrillations and positive waves were seen. The peroneus tertius muscle reveals 2 to 6K motor units with decreased recruitment.  3+ fibrillations and positive waves were seen. The medial gastrocnemius muscle reveals 1 to 4K motor units with slightly decreased recruitment.  3+ fibrillations  and positive waves were seen. The vastus lateralis muscle reveals 2 to 4K motor units with full recruitment. No fibrillations or positive waves were seen. The iliopsoas muscle reveals 2 to 4K motor units with full recruitment. No fibrillations or positive waves were seen. The biceps femoris muscle (long head) reveals 2 to 4K motor units with full recruitment. No fibrillations or positive waves were seen. The lumbosacral paraspinal muscles were tested at 3 levels, and revealed no abnormalities of insertional activity at all the lower level tested.  2+ positive waves were seen at the upper and middle levels. There was good relaxation.   IMPRESSION:   Nerve conduction studies done on the right upper extremity and both lower extremities reveals evidence of a primarily axonal peripheral neuropathy of moderate to severe severity. Nerve conduction of the upper extremity shows evidence of a carpal tunnel syndrome of mild to moderate severity. EMG evaluation of the right lower extremity shows significant distal acute and chronic denervation consistent with the diagnosis of peripheral neuropathy. There appears to be some denervation in the lumbosacral paraspinal muscles as well, there may be an overlying lumbosacral radiculopathy, possibly at the L5 or S1 level.  Jill Alexanders MD 01/11/2016 1:13 PM  [facility] [address] [place], [place] [address]  Phone [phone] Fax [phone]

## 2016-01-18 DIAGNOSIS — M545 Low back pain: Secondary | ICD-10-CM | POA: Diagnosis not present

## 2016-01-18 DIAGNOSIS — R2681 Unsteadiness on feet: Secondary | ICD-10-CM | POA: Diagnosis not present

## 2016-01-18 DIAGNOSIS — M6281 Muscle weakness (generalized): Secondary | ICD-10-CM | POA: Diagnosis not present

## 2016-01-22 ENCOUNTER — Encounter (HOSPITAL_BASED_OUTPATIENT_CLINIC_OR_DEPARTMENT_OTHER): Payer: Medicare Other

## 2016-01-22 DIAGNOSIS — L821 Other seborrheic keratosis: Secondary | ICD-10-CM | POA: Diagnosis not present

## 2016-01-22 DIAGNOSIS — L570 Actinic keratosis: Secondary | ICD-10-CM | POA: Diagnosis not present

## 2016-01-30 DIAGNOSIS — R2681 Unsteadiness on feet: Secondary | ICD-10-CM | POA: Diagnosis not present

## 2016-01-30 DIAGNOSIS — M545 Low back pain: Secondary | ICD-10-CM | POA: Diagnosis not present

## 2016-01-30 DIAGNOSIS — M6281 Muscle weakness (generalized): Secondary | ICD-10-CM | POA: Diagnosis not present

## 2016-02-02 ENCOUNTER — Ambulatory Visit (INDEPENDENT_AMBULATORY_CARE_PROVIDER_SITE_OTHER): Payer: Medicare Other

## 2016-02-02 ENCOUNTER — Ambulatory Visit (HOSPITAL_COMMUNITY)
Admission: RE | Admit: 2016-02-02 | Discharge: 2016-02-02 | Disposition: A | Discharge status: Home / Self Care | Payer: Medicare Other | Source: Ambulatory Visit | Attending: Family Medicine | Admitting: Family Medicine

## 2016-02-02 ENCOUNTER — Ambulatory Visit (INDEPENDENT_AMBULATORY_CARE_PROVIDER_SITE_OTHER): Payer: Medicare Other | Admitting: Family Medicine

## 2016-02-02 ENCOUNTER — Encounter (HOSPITAL_COMMUNITY): Payer: Self-pay

## 2016-02-02 VITALS — BP 140/74 | HR 69 | Temp 97.7°F | Resp 20 | Ht 65.75 in | Wt 134.0 lb

## 2016-02-02 DIAGNOSIS — R1031 Right lower quadrant pain: Secondary | ICD-10-CM

## 2016-02-02 DIAGNOSIS — R140 Abdominal distension (gaseous): Secondary | ICD-10-CM

## 2016-02-02 DIAGNOSIS — K4020 Bilateral inguinal hernia, without obstruction or gangrene, not specified as recurrent: Secondary | ICD-10-CM | POA: Insufficient documentation

## 2016-02-02 DIAGNOSIS — R188 Other ascites: Secondary | ICD-10-CM | POA: Diagnosis not present

## 2016-02-02 DIAGNOSIS — Z90710 Acquired absence of both cervix and uterus: Secondary | ICD-10-CM | POA: Diagnosis not present

## 2016-02-02 DIAGNOSIS — I2510 Atherosclerotic heart disease of native coronary artery without angina pectoris: Secondary | ICD-10-CM | POA: Diagnosis not present

## 2016-02-02 DIAGNOSIS — K7689 Other specified diseases of liver: Secondary | ICD-10-CM | POA: Insufficient documentation

## 2016-02-02 DIAGNOSIS — R109 Unspecified abdominal pain: Secondary | ICD-10-CM | POA: Diagnosis not present

## 2016-02-02 DIAGNOSIS — N319 Neuromuscular dysfunction of bladder, unspecified: Secondary | ICD-10-CM | POA: Diagnosis not present

## 2016-02-02 LAB — COMPREHENSIVE METABOLIC PANEL
ALT: 24 U/L (ref 6–29)
AST: 28 U/L (ref 10–35)
Albumin: 3.5 g/dL — ABNORMAL LOW (ref 3.6–5.1)
Alkaline Phosphatase: 86 U/L (ref 33–130)
BILIRUBIN TOTAL: 0.8 mg/dL (ref 0.2–1.2)
BUN: 11 mg/dL (ref 7–25)
CALCIUM: 9.1 mg/dL (ref 8.6–10.4)
CHLORIDE: 94 mmol/L — AB (ref 98–110)
CO2: 25 mmol/L (ref 20–31)
Creat: 0.54 mg/dL — ABNORMAL LOW (ref 0.60–0.88)
GLUCOSE: 99 mg/dL (ref 65–99)
Potassium: 3.5 mmol/L (ref 3.5–5.3)
SODIUM: 128 mmol/L — AB (ref 135–146)
Total Protein: 6.7 g/dL (ref 6.1–8.1)

## 2016-02-02 LAB — POCT CBC
GRANULOCYTE PERCENT: 80.6 % — AB (ref 37–80)
HEMATOCRIT: 34.6 % — AB (ref 37.7–47.9)
Hemoglobin: 12 g/dL — AB (ref 12.2–16.2)
Lymph, poc: 1.4 (ref 0.6–3.4)
MCH: 29.9 pg (ref 27–31.2)
MCHC: 34.6 g/dL (ref 31.8–35.4)
MCV: 86.4 fL (ref 80–97)
MID (CBC): 0.6 (ref 0–0.9)
MPV: 7.7 fL (ref 0–99.8)
PLATELET COUNT, POC: 219 10*3/uL (ref 142–424)
POC GRANULOCYTE: 8.6 — AB (ref 2–6.9)
POC LYMPH PERCENT: 13.4 %L (ref 10–50)
POC MID %: 6 %M (ref 0–12)
RBC: 4.01 M/uL — AB (ref 4.04–5.48)
RDW, POC: 13.2 %
WBC: 10.7 10*3/uL — AB (ref 4.6–10.2)

## 2016-02-02 MED ORDER — IOHEXOL 300 MG/ML  SOLN
100.0000 mL | Freq: Once | INTRAMUSCULAR | Status: AC | PRN
  Administered 2016-02-02: 100 mL via INTRAVENOUS

## 2016-02-02 MED ORDER — IOHEXOL 300 MG/ML  SOLN
25.0000 mL | Freq: Once | INTRAMUSCULAR | Status: AC | PRN
  Administered 2016-02-02: 25 mL via ORAL

## 2016-02-02 NOTE — Progress Notes (Addendum)
[facility] [address], [place] [address] [phone]  Date:  02/02/2016   Name:  Gina ROSE   DOB:  January 25, 1920   MRN:  [id number]  PCP:  Estill Dooms, MD    Chief Complaint: OTHER   History of Present Illness:  Gina Bolton is a 82 y.o. very pleasant female patient who presents with the following:  Elderly lady here today as a new patient with complaint of a recurring pain in her right side.  It first occured about 2 weeks ago- they though it was due to gas. It went away after a day or so.  They it came back yesterday evening and was quite bad last night.  It is still present now- not as bad as it was last night however.    Her daughter is here with here today   No nausea or vomiting for the last several weeks- she did have an episode of nausea/ vomiting several weeks ago but this resolved after 24 hours.  Thought to be a viral illness  She has been constipated recently- she used some miralax and did have a good BM 1-2 days ago She did eat well yesterday.  Overall her appetite has been ok She thinks that her appendix was removed during her hysterectomy years ago- however we do not have proof that this was done (operation was many years ago) She has not noted a fever- she did check her temp She has not noted any urianry sx She is generally in good health for her age    Patient Active Problem List   Diagnosis Date Noted  . Carpal tunnel syndrome 01/11/2016  . Wound of left leg 12/18/2015  . History of fall 11/06/2015  . Stress 11/06/2015  . Paresthesia 11/06/2015  . Concussion with no loss of consciousness 08/07/2015  . Biceps tendon rupture, proximal 08/07/2015  . Adjustment disorder with mixed anxiety and depressed mood 07/03/2015  . Unstable gait 05/03/2015  . Rosacea 05/01/2015  . Ocular migraine 09/05/2014  . Urgency of urination 09/05/2014  . Peripheral neuropathy ([place]) 10/10/2013  . Memory disturbance 10/10/2013  .  Coccyx contusion 10/04/2013  . Edema 10/09/2010  . Spinal stenosis in cervical region 09/10/2010  . Cervicalgia 11/20/2009  . Spinal stenosis, lumbar region, without neurogenic claudication 10/12/2009  . Obstructive sleep apnea (adult) (pediatric) 04/21/2006  . Essential hypertension 08/02/2002  . Hypothyroidism 04/19/2001  . Other and unspecified hyperlipidemia 03/09/2001    Past Medical History  Diagnosis Date  . Dysphonia 01/25/2013  . Mild cognitive impairment, so stated 12/02/2011  . Chronic rhinitis 12/02/2011  . Urgency of urination 08/26/2011  . Edema 10/09/2010  . Spinal stenosis in cervical region 09/10/2010  . Closed fracture of second cervical vertebra without mention of spinal cord injury 09/10/2010  . Urinary frequency 07/30/2010  . Closed fracture of unspecified part of ramus of mandible 06/11/2010  . Cervicalgia 11/20/2009  . Shortness of breath 11/20/2009  . Spinal stenosis, lumbar region, without neurogenic claudication 08/13/2009  . Unspecified hereditary and idiopathic peripheral neuropathy 05/08/2009  . Rosacea 10/24/2008  . Hemangioma of other sites 9//2008  . Other sequelae of chronic liver disease 08/31/2007  . Acquired cyst of kidney 9///2008  . Unspecified constipation 02/23/2007  . Neurogenic bladder, NOS 02/23/2007  . Obstructive sleep apnea (adult) (pediatric) 04/21/2006  . Abnormality of gait 11/11/2005  . Disturbance of skin sensation 11/11/2005  . Unspecified essential hypertension 08/02/2002  . Unspecified hypothyroidism 04/19/2001  .  Other and unspecified hyperlipidemia 03/09/2001  . Osteoarthrosis, unspecified whether generalized or localized, unspecified site 03/09/2001  . Senile osteoporosis 03/09/2001  . Hereditary spherocytosis ([place]) 01/14/2000  . Allergic rhinitis, cause unspecified 01/14/2000  . Cramp of limb 01/14/2000  . Personal history of malignant neoplasm of breast 01/14/2000  . Malignant neoplasm of breast (female), unspecified site 05/24/1997    left  .  Disorder of bone and cartilage, unspecified 05/04/1995  . Cloudy urine   . History of fall 11/06/2015    11/04/15  . Paresthesia 11/06/2015  . Carpal tunnel syndrome 01/11/2016     Right  . Peripheral neuropathy ([place]) 10/10/2013    Numb feeling in legs reported 09/06/13     Past Surgical History  Procedure Laterality Date  . Tonsillectomy    . Abdominal hysterectomy  1974  . Breast lumpectomy Left 1998    Dr. Coletta Memos  . Bladder repair    . Cataract extraction w/ intraocular lens implant  07/1999    Dr. Bing Plume  . Corneal transplant Left 2002  . Colonoscopy  02/28/1999    no polyps small internal hemorrhoid noted    Social History  Substance Use Topics  . Smoking status: Never Smoker   . Smokeless tobacco: Never Used  . Alcohol Use: 4.2 oz/week    7 Glasses of wine per week     Comment: nightly     Family History  Problem Relation Age of Onset  . Cancer Father     lung  . Cancer Sister     breast     Allergies  Allergen Reactions  . Ibuprofen Nausea Only  . Macrobid [Nitrofurantoin Monohyd Macro]     Swelling and flush   . Sulfa Antibiotics     Medication list has been reviewed and updated.  Current Outpatient Prescriptions on File Prior to Visit  Medication Sig Dispense Refill  . Calcium Carb-Cholecalciferol (CALCIUM + D3) 600-200 MG-UNIT TABS Take by mouth. Take one tablet twice daily    . Cholecalciferol (VITAMIN D-400 PO) Take by mouth. Take one daily for vitamin D supplement    . citalopram (CELEXA) 10 MG tablet TAKE 1 TABLET BY MOUTH DAILY TO HELP WITH ANXIETY AND DEPRESSION 30 tablet 5  . cycloSPORINE (RESTASIS) 0.05 % ophthalmic emulsion 1 drop every 12 (twelve) hours. One drop into left eye twice daily    . estradiol (ESTRACE) 0.1 MG/GM vaginal cream Apply 1 gm to vaginal area 3 times a week as directed. 42 g 5  . hydrochlorothiazide (HYDRODIURIL) 25 MG tablet TAKE 1 TABLET (25 MG TOTAL) BY MOUTH DAILY. FOR BLOOD PRESSURE 120 tablet 1  . losartan  (COZAAR) 100 MG tablet One daily to control BP (Patient taking differently: Take 50 mg by mouth daily. One daily to control BP) 90 tablet 3  . Magnesium 400 MG CAPS Take by mouth. Take one tablet twice daily for leg cramps    . metoprolol (LOPRESSOR) 50 MG tablet One daily in morning to control blood pressure 90 tablet 3  . metroNIDAZOLE (METROGEL) 0.75 % gel Apply topically twice daly to help rash 45 g 3  . Multiple Vitamins-Minerals ([employer] SPECTRAVITE PO) Take by mouth. One daily for eyes    . naproxen sodium (ALEVE) 220 MG tablet Take 220 mg by mouth. Take two tablets daily as needed for pain    . raloxifene (EVISTA) 60 MG tablet Take 1 tablet (60 mg total) by mouth daily. To treat osteoporosis 90 tablet 3  . sodium chloride (  MURO 128) 5 % ophthalmic solution Place 1 drop into the right eye 2 (two) times daily.     . vitamin C (ASCORBIC ACID) 500 MG tablet Take 500 mg by mouth daily.    Marland Kitchen desonide (DESOWEN) 0.05 % cream Apply topically 2 (two) times daily. Reported on 02/02/2016     No current facility-administered medications on file prior to visit.    Review of Systems:  As per HPI- otherwise negative.   Physical Examination: Filed Vitals:   02/02/16 1325  BP: 140/74  Pulse: 69  Temp: 97.7 F (36.5 C)  Resp: 20   Filed Vitals:   02/02/16 1325  Height: 5' 5.75" (1.67 m)  Weight: 134 lb (60.782 kg)   Body mass index is 21.79 kg/(m^2). Ideal Body Weight: Weight in (lb) to have BMI = 25: 153.4  GEN: WDWN, NAD, Non-toxic, A & O x 3, looks well HEENT: Atraumatic, Normocephalic. Neck supple. No masses, No LAD.  PEERL, oropharynx wnl Ears and Nose: No external deformity. CV: RRR, No M/G/R. No JVD. No thrill. No extra heart sounds. PULM: CTA B, no wheezes, crackles, rhonchi. No retractions. No resp. distress. No accessory muscle use. ABD: normal BS. No rebound. No HSM.  Belly is a bit distended and slightly tender in the mid right abdomen EXTR: No c/c/e NEURO Normal gait.  PSYCH:  Normally interactive. Conversant. Not depressed or anxious appearing.  Calm demeanor.   [facility] reading (PRIMARY) by  Dr. Lorelei Pont. abd series: IMPRESSION: No evidence of bowel obstruction or pneumoperitoneum. Moderate amount of stool within the ascending colon/rectum.  Trace left pleural effusion versus pleural thickening    Results for orders placed or performed in visit on 02/02/16  POCT CBC  Result Value Ref Range   WBC 10.7 (A) 4.6 - 10.2 K/uL   Lymph, poc 1.4 0.6 - 3.4   POC LYMPH PERCENT 13.4 10 - 50 %L   MID (cbc) 0.6 0 - 0.9   POC MID % 6.0 0 - 12 %M   POC Granulocyte 8.6 (A) 2 - 6.9   Granulocyte percent 80.6 (A) 37 - 80 %G   RBC 4.01 (A) 4.04 - 5.48 M/uL   Hemoglobin 12.0 (A) 12.2 - 16.2 g/dL   HCT, POC 34.6 (A) 37.7 - 47.9 %   MCV 86.4 80 - 97 fL   MCH, POC 29.9 27 - 31.2 pg   MCHC 34.6 31.8 - 35.4 g/dL   RDW, POC 13.2 %   Platelet Count, POC 219 142 - 424 K/uL   MPV 7.7 0 - 99.8 fL    Assessment and Plan: Abdominal bloating - Plan: POCT CBC, Comprehensive metabolic panel, DG Abd Acute W/Chest, CT Abdomen Pelvis W Contrast  Right lower quadrant abdominal pain - Plan: POCT CBC, Comprehensive metabolic panel, DG Abd Acute W/Chest, CT Abdomen Pelvis W Contrast  Here today with abd distension and discomfort.  She is not toxic but as her daughter is here today to help her we would like to use this opportunity to get a CT scan for dx.  They are willing to proceed with scan-   Signed Lamar Blinks, MD  [phone]  [name], daughter  Received her CT report as below.  Discussed with general surgeon on call.  At this time we can feel confident that the liver cyst described below is benign, and may not be the cause of her sx.  Given her age observation is likely her best option.  Consider consultation with Dr. Barry Dienes  if they want more information.  Called and discussed with pt and her daughter [name].  Suggest that she continue to use miralax every 1-2 days.   Will forward scan report to her PCP Dr. Nyoka Cowden and will give him a call on Monday. Meanwhile await her labs, they will let me know if any problems or concerns   CT ABDOMEN AND PELVIS WITH CONTRAST  TECHNIQUE: Multidetector CT imaging of the abdomen and pelvis was performed using the standard protocol following bolus administration of intravenous contrast.  CONTRAST: 119mL OMNIPAQUE IOHEXOL 300 MG/ML SOLN  COMPARISON: Lumbar spine MRI dated 09/11/2009  FINDINGS: Lower chest: Small left and trace right all pleural effusions. Associated mild left lower lobe opacity, likely atelectasis.  Hepatobiliary: Dominant 11.3 x 13.9 x 15.7 cm cyst in the posterior right hepatic lobe (series 2/ image 24) demonstrates a mildly thickened rim and layering debris/hemorrhage (coronal image 57). Associated mass effect on the hepatic capsule and adjacent structures in the right upper quadrant. Lesion was incompletely visualized but measures at least 7.2 x 5.2 cm in 2010.  Additional scattered hepatic cysts measuring up to 5.2 cm. 10 mm probable flash filling hemangioma inferiorly in the anterior segment right hepatic lobe (series 2/ image 40).  Gallbladder is within normal limits. No intrahepatic or extrahepatic ductal dilatation.  Pancreas: Pancreatic atrophy.  Spleen: 10 mm cyst along the anterior spleen (series 2/image 10).  Adrenals/Urinary Tract: Left adrenal gland is within normal limits. Right adrenal gland is poorly visualized due to extrinsic compression (series 2/ image 26), but likely within normal limits.  5.6 cm left upper pole renal cyst (series 2/ image 19). 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 No hydronephrosis.  Bladder is notable for a tiny focus of nondependent gas (series 2/ image 68), correlate for intervention.  Stomach/Bowel: Stomach is notable for a tiny hiatal hernia.  No evidence of bowel obstruction. Appendix is not  discretely visualized. Colonic diverticulosis, without evidence of diverticulitis. Vascular/Lymphatic: Atherosclerotic calcifications of the abdominal aorta and branch vessels.  No evidence of abdominal aortic aneurysm. No suspicious abdominopelvic lymphadenopathy. Reproductive: Status post hysterectomy. No adnexal masses. Other: Small volume pelvic ascites. Tiny fat containing bilateral inguinal hernias (series 2/image 34). Musculoskeletal: Moderate degenerative changes of the visualized thoracolumbar spine. Lumbar levoscoliosis. Vertebral hemangioma at T12.  IMPRESSION: Dominant 15.7 cm thick-walled hepatic cyst in the posterior right hepatic lobe with layering debris/hemorrhage. Associated mass effect on the hepatic capsule, favored to be the cause of the patient's symptoms. This cyst was incompletely visualized in 2010, measuring at least 7.2 cm at that time.  Additional scattered hepatic cysts measuring up to 5.2 cm, but without superimposed inflammatory changes or hemorrhage.  No evidence of bowel obstruction.  Additional ancillary findings as above.  Called her on 2/12 to go over the rest of her labs  Results for orders placed or performed in visit on 02/02/16  Comprehensive metabolic panel  Result Value Ref Range   Sodium 128 (L) 135 - 146 mmol/L   Potassium 3.5 3.5 - 5.3 mmol/L   Chloride 94 (L) 98 - 110 mmol/L   CO2 25 20 - 31 mmol/L   Glucose, Bld 99 65 - 99 mg/dL   BUN 11 7 - 25 mg/dL   Creat 0.54 (L) 0.60 - 0.88 mg/dL   Total Bilirubin 0.8 0.2 - 1.2 mg/dL   Alkaline Phosphatase 86 33 - 130 U/L   AST 28 10 - 35 U/L   ALT 24 6 - 29 U/L  Total Protein 6.7 6.1 - 8.1 g/dL   Albumin 3.5 (L) 3.6 - 5.1 g/dL   Calcium 9.1 8.6 - 10.4 mg/dL  POCT CBC  Result Value Ref Range   WBC 10.7 (A) 4.6 - 10.2 K/uL   Lymph, poc 1.4 0.6 - 3.4   POC LYMPH PERCENT 13.4 10 - 50 %L   MID (cbc) 0.6 0 - 0.9   POC MID % 6.0 0 - 12 %M   POC Granulocyte 8.6 (A) 2 - 6.9    Granulocyte percent 80.6 (A) 37 - 80 %G   RBC 4.01 (A) 4.04 - 5.48 M/uL   Hemoglobin 12.0 (A) 12.2 - 16.2 g/dL   HCT, POC 34.6 (A) 37.7 - 47.9 %   MCV 86.4 80 - 97 fL   MCH, POC 29.9 27 - 31.2 pg   MCHC 34.6 31.8 - 35.4 g/dL   RDW, POC 13.2 %   Platelet Count, POC 219 142 - 424 K/uL   MPV 7.7 0 - 99.8 fL   Her Na is low- she will reduce free water an increase her salt intake, hold HCTZ for the time being BP Readings from Last 3 Encounters:  02/02/16 140/74  12/18/15 150/80  11/29/15 144/82   I will call her PCP tomorrow and arrange a follow-up for a recheck and labs in a week or so  2/13- called [facility]- she is actually a pt of Dr. Nyoka Cowden at [facility].  Made her an appt for tomorrow at 11:15.  Called to let Candita know and will fax records for Dr. Nyoka Cowden.  Srija reports that she has not had a good BM the last couple of days despite taking miralax.  Advised her to take 2 doses today

## 2016-02-02 NOTE — Patient Instructions (Addendum)
Go to [place] at the Emergency Department for OUTPATIENT CT. DO NOT REGISTER AS ED PATIENT.  Because you received an x-ray today, you will receive an invoice from [facility]iology. Please contact [facility] Radiology at [phone] with questions or concerns regarding your invoice. Our billing staff will not be able to assist you with those questions.

## 2016-02-03 ENCOUNTER — Encounter: Payer: Self-pay | Admitting: Family Medicine

## 2016-02-04 ENCOUNTER — Other Ambulatory Visit: Payer: Self-pay | Admitting: Family Medicine

## 2016-02-04 DIAGNOSIS — R2681 Unsteadiness on feet: Secondary | ICD-10-CM | POA: Diagnosis not present

## 2016-02-04 DIAGNOSIS — M6281 Muscle weakness (generalized): Secondary | ICD-10-CM | POA: Diagnosis not present

## 2016-02-04 DIAGNOSIS — M545 Low back pain: Secondary | ICD-10-CM | POA: Diagnosis not present

## 2016-02-05 ENCOUNTER — Non-Acute Institutional Stay: Payer: Medicare Other | Admitting: Internal Medicine

## 2016-02-05 ENCOUNTER — Encounter: Payer: Self-pay | Admitting: Internal Medicine

## 2016-02-05 VITALS — BP 120/82 | HR 92 | Temp 97.4°F | Resp 20 | Ht 65.0 in | Wt 134.0 lb

## 2016-02-05 DIAGNOSIS — K7689 Other specified diseases of liver: Secondary | ICD-10-CM | POA: Diagnosis not present

## 2016-02-05 DIAGNOSIS — R413 Other amnesia: Secondary | ICD-10-CM

## 2016-02-05 DIAGNOSIS — I10 Essential (primary) hypertension: Secondary | ICD-10-CM | POA: Diagnosis not present

## 2016-02-05 NOTE — Progress Notes (Signed)
Patient ID: Gina Bolton, female   DOB: Jun 05, 1920, 82 y.o.   MRN: [id number]    [facility]     Place of Service: Clinic (12)     Allergies  Allergen Reactions  . Ibuprofen Nausea Only  . Macrobid [Nitrofurantoin Monohyd Macro]     Swelling and flush   . Sulfa Antibiotics     Chief Complaint  Patient presents with  . Hospitalization Follow-up    pain rt side on Saturday morning, MMSE done    HPI:  Seen in [facility] February 02 2016. Complaint pain in the right side. Had CT of the abdomen pelvis done with contrast. It discloses a 15.7 cm thick-walled hepatic cyst in the posterior right hepatic lobe with layering of debris/hemorrhage. There was an associated mass effect of the hepatic capsule favored to be the cause of the patient's right-sided discomfort. The cyst was visualized in 2010 measuring at least 7.2 cm at that time. Additional hepatic cysts were also noted.  CMP was normal except for slightly low sodium level of 128. CBC likewise was normal with hemoglobin 12.0, WBCs 10,700, normal platelets.  She continues to have some right-sided discomfort.  Memory issues are about the same scored very well on her MMSE with 30/30 and passed her clock drawing test.  Husband expired since her last visit with me. She is at peace with his death. He had significant dementia problems as well as a dysphagia causing a persistent cough.   Medications: Patient's Medications  New Prescriptions   No medications on file  Previous Medications   CALCIUM CARB-CHOLECALCIFEROL (CALCIUM + D3) 600-200 MG-UNIT TABS    Take by mouth. Take one tablet twice daily   CHOLECALCIFEROL (VITAMIN D-400 PO)    Take by mouth. Take one daily for vitamin D supplement   CITALOPRAM (CELEXA) 10 MG TABLET    TAKE 1 TABLET BY MOUTH DAILY TO HELP WITH ANXIETY AND DEPRESSION   CYCLOSPORINE (RESTASIS) 0.05 % OPHTHALMIC EMULSION    1 drop every 12 (twelve) hours. One drop into left eye twice  daily   DESONIDE (DESOWEN) 0.05 % CREAM    Apply topically 2 (two) times daily. Reported on 02/02/2016   ESTRADIOL (ESTRACE) 0.1 MG/GM VAGINAL CREAM    Apply 1 gm to vaginal area 3 times a week as directed.   HYDROCHLOROTHIAZIDE (HYDRODIURIL) 25 MG TABLET    TAKE 1 TABLET (25 MG TOTAL) BY MOUTH DAILY. FOR BLOOD PRESSURE   LOSARTAN (COZAAR) 100 MG TABLET    One daily to control BP   MAGNESIUM 400 MG CAPS    Take by mouth. Take one tablet twice daily for leg cramps   METOPROLOL (LOPRESSOR) 50 MG TABLET    One daily in morning to control blood pressure   METRONIDAZOLE (METROGEL) 0.75 % GEL    Apply topically twice daly to help rash   MULTIPLE VITAMINS-MINERALS ([employer]S SPECTRAVITE PO)    Take by mouth. One daily for eyes   NAPROXEN SODIUM (ALEVE) 220 MG TABLET    Take 220 mg by mouth. Take two tablets daily as needed for pain   RALOXIFENE (EVISTA) 60 MG TABLET    Take 1 tablet (60 mg total) by mouth daily. To treat osteoporosis   SODIUM CHLORIDE (MURO 128) 5 % OPHTHALMIC SOLUTION    Place 1 drop into the right eye 2 (two) times daily.    VITAMIN C (ASCORBIC ACID) 500 MG TABLET    Take 500 mg by mouth daily.  Modified Medications   No medications on file  Discontinued Medications   No medications on file     Review of Systems  Constitutional: Positive for fatigue. Negative for fever, chills, diaphoresis, activity change, appetite change and unexpected weight change.  HENT: Positive for hearing loss. Negative for congestion, ear pain, mouth sores, rhinorrhea, sinus pressure and sore throat.   Eyes: Positive for visual disturbance (Rx lenses).       Ocular migraine right eye.  Respiratory: Negative for cough, chest tightness, wheezing and stridor.   Cardiovascular: Negative for chest pain, palpitations and leg swelling.  Gastrointestinal: Negative for nausea, abdominal pain, diarrhea, constipation and abdominal distention.       Cyst in liver.  Endocrine: Negative.  Negative for cold intolerance,  heat intolerance and polyuria.  Genitourinary:       Chronic leakage. Has had urologic evaluation. Renal cyst  Musculoskeletal: Positive for back pain, gait problem (unsteady balance) and neck pain. Negative for myalgias, joint swelling, arthralgias and neck stiffness.  Skin:       Left anterior shin has a wond present since October 2016. Started as a skin tear . Scaling around the wound and periwound erythema. No purulence.  Allergic/Immunologic: Negative.   Neurological: Positive for weakness. Negative for dizziness, tremors, seizures, syncope, facial asymmetry, speech difficulty, light-headedness, numbness and headaches.       Some balance issues. Patient has had 2 falls in the last 6 weeks. Has noted numbness and some tingling in the feet and lower legs. Fingers and hands seem to be spared. CT scan of the brain in March 2016 showed small vessel disease, but was otherwise normal.  Hematological: Negative.   Psychiatric/Behavioral: Positive for dysphoric mood. The patient is nervous/anxious.        Constant worries about what the future holds for her and her increasingly demented husband.    Filed Vitals:   02/05/16 1119  BP: 120/82  Pulse: 92  Temp: 97.4 F (36.3 C)  TempSrc: Oral  Resp: 20  Height: 5' 5"  (1.651 m)  Weight: 134 lb (60.782 kg)  SpO2: 91%   Wt Readings from Last 3 Encounters:  02/05/16 134 lb (60.782 kg)  02/02/16 134 lb (60.782 kg)  12/18/15 134 lb 12.8 oz (61.145 kg)    Body mass index is 22.3 kg/(m^2).  Physical Exam  Constitutional: She is oriented to person, place, and time. She appears well-developed and well-nourished. No distress.  HENT:  Head: Normocephalic and atraumatic.  Right Ear: External ear normal.  Left Ear: External ear normal.  Nose: Nose normal.  Mouth/Throat: Oropharynx is clear and moist.  Eyes: Conjunctivae and EOM are normal. Pupils are equal, round, and reactive to light.  Corrective lenses.  Neck: Neck supple. No JVD present. No  tracheal deviation present. No thyromegaly present.  Cardiovascular: Normal rate, regular rhythm, normal heart sounds and intact distal pulses.  Exam reveals no gallop and no friction rub.   No murmur heard. Pulmonary/Chest: No respiratory distress. She has no wheezes. She has no rales. She exhibits no tenderness.  Abdominal: She exhibits no distension and no mass. There is tenderness. There is guarding.  Tender right upper quadrant and right flank.  Musculoskeletal: Normal range of motion. She exhibits no edema or tenderness.  Hx of Rupture of the head of the right biceps laterally  Lymphadenopathy:    She has no cervical adenopathy.  Neurological: She is alert and oriented to person, place, and time. She has normal reflexes. No cranial nerve deficit.  Coordination normal.  Skin: No rash noted. No erythema. No pallor.  Since Oct 2016, has had persistent wound anteriorly in the left mid shaft of the tibia. Periwound erythema. No purulence. Scaling at wound.  Psychiatric: Her behavior is normal. Judgment and thought content normal.  chronic anxiety and mild depression     Labs reviewed: Lab Summary Latest Ref Rng 02/02/2016 11/29/2015 07/30/2015  Hemoglobin 12.2 - 16.2 g/dL 12.0(A) 12.5 (None)  Hematocrit 37.7 - 47.9 % 34.6(A) 38 (None)  White count 4.6 - 10.2 K/uL 10.7(A) 5.6 (None)  Platelet count 150 - 399 K/L (None) 193 (None)  Sodium 135 - 146 mmol/L 128(L) 135(A) 131(A)  Potassium 3.5 - 5.3 mmol/L 3.5 3.8 3.7  Calcium 8.6 - 10.4 mg/dL 9.1 (None) (None)  Phosphorus - (None) (None) (None)  Creatinine 0.60 - 0.88 mg/dL 0.54(L) 0.6 0.6  AST 10 - 35 U/L 28 34 25  Alk Phos 33 - 130 U/L 86 91 44  Bilirubin 0.2 - 1.2 mg/dL 0.8 (None) (None)  Glucose 65 - 99 mg/dL 99 96 84  Cholesterol - (None) (None) (None)  HDL cholesterol - (None) (None) (None)  Triglycerides - (None) (None) (None)  LDL Direct - (None) (None) (None)  LDL Calc - (None) (None) (None)  Total protein 6.1 - 8.1 g/dL 6.7  (None) (None)  Albumin 3.6 - 5.1 g/dL 3.5(L) (None) (None)   Lab Results  Component Value Date   TSH 4.00 11/29/2015   Lab Results  Component Value Date   BUN 11 02/02/2016   BUN 11 11/29/2015   BUN 10 07/30/2015   Lab Results  Component Value Date   CREATININE 0.54* 02/02/2016   CREATININE 0.6 11/29/2015   CREATININE 0.6 07/30/2015   No results found for: HGBA1C     Assessment/Plan  1. Liver cyst Etiology of the cyst is uncertain. Patient traveled to [place] as a younger woman and it is conceivable she was exposed to parasites there. I advised the patient I really don't know what to do about this cyst. There may be some potential for aspiration of cyst see if her right-sided discomfort improves. Simultaneous with that she should probably have additional testing to include cytology and possible parasitology . Alternatively surgery may be a consideration, but she says she does not want to have surgery unless good reasons are presented to her. - Ambulatory referral to Gastroenterology, has previously seen Dr. Leonie Douglas.  2. Memory disturbance It is possible that much of her memory disturbance is related to the stress of caring for her husband. With the excellent scar on MMSE, I have elected just follow this for the time being.  3. Essential hypertension Controlled

## 2016-02-08 DIAGNOSIS — K7689 Other specified diseases of liver: Secondary | ICD-10-CM | POA: Diagnosis not present

## 2016-02-08 DIAGNOSIS — M545 Low back pain: Secondary | ICD-10-CM | POA: Diagnosis not present

## 2016-02-08 DIAGNOSIS — R2681 Unsteadiness on feet: Secondary | ICD-10-CM | POA: Diagnosis not present

## 2016-02-08 DIAGNOSIS — M6281 Muscle weakness (generalized): Secondary | ICD-10-CM | POA: Diagnosis not present

## 2016-02-08 DIAGNOSIS — I10 Essential (primary) hypertension: Secondary | ICD-10-CM | POA: Diagnosis not present

## 2016-02-11 DIAGNOSIS — R2681 Unsteadiness on feet: Secondary | ICD-10-CM | POA: Diagnosis not present

## 2016-02-11 DIAGNOSIS — M6281 Muscle weakness (generalized): Secondary | ICD-10-CM | POA: Diagnosis not present

## 2016-02-11 DIAGNOSIS — M545 Low back pain: Secondary | ICD-10-CM | POA: Diagnosis not present

## 2016-02-13 DIAGNOSIS — R2681 Unsteadiness on feet: Secondary | ICD-10-CM | POA: Diagnosis not present

## 2016-02-13 DIAGNOSIS — M545 Low back pain: Secondary | ICD-10-CM | POA: Diagnosis not present

## 2016-02-13 DIAGNOSIS — M6281 Muscle weakness (generalized): Secondary | ICD-10-CM | POA: Diagnosis not present

## 2016-02-15 ENCOUNTER — Other Ambulatory Visit: Payer: Self-pay | Admitting: Internal Medicine

## 2016-02-15 ENCOUNTER — Telehealth: Payer: Self-pay

## 2016-02-15 DIAGNOSIS — H9193 Unspecified hearing loss, bilateral: Secondary | ICD-10-CM

## 2016-02-15 DIAGNOSIS — R2681 Unsteadiness on feet: Secondary | ICD-10-CM | POA: Diagnosis not present

## 2016-02-15 DIAGNOSIS — Z974 Presence of external hearing-aid: Secondary | ICD-10-CM

## 2016-02-15 DIAGNOSIS — M6281 Muscle weakness (generalized): Secondary | ICD-10-CM | POA: Diagnosis not present

## 2016-02-15 DIAGNOSIS — M545 Low back pain: Secondary | ICD-10-CM | POA: Diagnosis not present

## 2016-02-15 NOTE — Telephone Encounter (Signed)
The hearing clinic called and spoke with Earlie Server (referral coordinator) and stated that the patient has an appointment but the will need a referral prior to appointment.   I called patient to confirm reason for visit with audiology. Patient states she has bilateral decrease in hearing and is currently wearing hearing aids. Patient was established with a ear specialist at [facility] but would like somewhere closer.  Referral order placed, Earlie Server was verbally informed.

## 2016-02-18 DIAGNOSIS — M545 Low back pain: Secondary | ICD-10-CM | POA: Diagnosis not present

## 2016-02-18 DIAGNOSIS — M6281 Muscle weakness (generalized): Secondary | ICD-10-CM | POA: Diagnosis not present

## 2016-02-18 DIAGNOSIS — R2681 Unsteadiness on feet: Secondary | ICD-10-CM | POA: Diagnosis not present

## 2016-02-27 DIAGNOSIS — R2681 Unsteadiness on feet: Secondary | ICD-10-CM | POA: Diagnosis not present

## 2016-02-27 DIAGNOSIS — M6281 Muscle weakness (generalized): Secondary | ICD-10-CM | POA: Diagnosis not present

## 2016-02-27 DIAGNOSIS — M545 Low back pain: Secondary | ICD-10-CM | POA: Diagnosis not present

## 2016-02-29 DIAGNOSIS — R2681 Unsteadiness on feet: Secondary | ICD-10-CM | POA: Diagnosis not present

## 2016-02-29 DIAGNOSIS — M545 Low back pain: Secondary | ICD-10-CM | POA: Diagnosis not present

## 2016-02-29 DIAGNOSIS — M6281 Muscle weakness (generalized): Secondary | ICD-10-CM | POA: Diagnosis not present

## 2016-03-03 DIAGNOSIS — R2681 Unsteadiness on feet: Secondary | ICD-10-CM | POA: Diagnosis not present

## 2016-03-03 DIAGNOSIS — M545 Low back pain: Secondary | ICD-10-CM | POA: Diagnosis not present

## 2016-03-03 DIAGNOSIS — M6281 Muscle weakness (generalized): Secondary | ICD-10-CM | POA: Diagnosis not present

## 2016-03-04 DIAGNOSIS — H903 Sensorineural hearing loss, bilateral: Secondary | ICD-10-CM | POA: Diagnosis not present

## 2016-03-05 DIAGNOSIS — M545 Low back pain: Secondary | ICD-10-CM | POA: Diagnosis not present

## 2016-03-05 DIAGNOSIS — M6281 Muscle weakness (generalized): Secondary | ICD-10-CM | POA: Diagnosis not present

## 2016-03-05 DIAGNOSIS — R2681 Unsteadiness on feet: Secondary | ICD-10-CM | POA: Diagnosis not present

## 2016-03-07 DIAGNOSIS — M6281 Muscle weakness (generalized): Secondary | ICD-10-CM | POA: Diagnosis not present

## 2016-03-07 DIAGNOSIS — R2681 Unsteadiness on feet: Secondary | ICD-10-CM | POA: Diagnosis not present

## 2016-03-07 DIAGNOSIS — M545 Low back pain: Secondary | ICD-10-CM | POA: Diagnosis not present

## 2016-03-10 DIAGNOSIS — M6281 Muscle weakness (generalized): Secondary | ICD-10-CM | POA: Diagnosis not present

## 2016-03-10 DIAGNOSIS — M545 Low back pain: Secondary | ICD-10-CM | POA: Diagnosis not present

## 2016-03-10 DIAGNOSIS — R2681 Unsteadiness on feet: Secondary | ICD-10-CM | POA: Diagnosis not present

## 2016-03-12 DIAGNOSIS — R2681 Unsteadiness on feet: Secondary | ICD-10-CM | POA: Diagnosis not present

## 2016-03-12 DIAGNOSIS — M545 Low back pain: Secondary | ICD-10-CM | POA: Diagnosis not present

## 2016-03-12 DIAGNOSIS — M6281 Muscle weakness (generalized): Secondary | ICD-10-CM | POA: Diagnosis not present

## 2016-03-13 ENCOUNTER — Other Ambulatory Visit: Payer: Self-pay

## 2016-03-13 DIAGNOSIS — I10 Essential (primary) hypertension: Secondary | ICD-10-CM | POA: Diagnosis not present

## 2016-03-13 LAB — BASIC METABOLIC PANEL
BUN: 15 mg/dL (ref 4–21)
Creatinine: 0.7 mg/dL (ref 0.5–1.1)
Glucose: 86 mg/dL
Potassium: 4.5 mmol/L (ref 3.4–5.3)
Sodium: 137 mmol/L (ref 137–147)

## 2016-03-14 DIAGNOSIS — M545 Low back pain: Secondary | ICD-10-CM | POA: Diagnosis not present

## 2016-03-14 DIAGNOSIS — M6281 Muscle weakness (generalized): Secondary | ICD-10-CM | POA: Diagnosis not present

## 2016-03-14 DIAGNOSIS — R2681 Unsteadiness on feet: Secondary | ICD-10-CM | POA: Diagnosis not present

## 2016-03-17 DIAGNOSIS — M545 Low back pain: Secondary | ICD-10-CM | POA: Diagnosis not present

## 2016-03-17 DIAGNOSIS — R2681 Unsteadiness on feet: Secondary | ICD-10-CM | POA: Diagnosis not present

## 2016-03-17 DIAGNOSIS — M6281 Muscle weakness (generalized): Secondary | ICD-10-CM | POA: Diagnosis not present

## 2016-03-18 ENCOUNTER — Non-Acute Institutional Stay: Payer: Medicare Other | Admitting: Internal Medicine

## 2016-03-18 ENCOUNTER — Encounter: Payer: Self-pay | Admitting: Internal Medicine

## 2016-03-18 VITALS — BP 138/82 | HR 60 | Temp 97.8°F | Ht 64.0 in | Wt 134.0 lb

## 2016-03-18 DIAGNOSIS — S90424A Blister (nonthermal), right lesser toe(s), initial encounter: Secondary | ICD-10-CM | POA: Diagnosis not present

## 2016-03-18 DIAGNOSIS — I10 Essential (primary) hypertension: Secondary | ICD-10-CM

## 2016-03-18 DIAGNOSIS — R609 Edema, unspecified: Secondary | ICD-10-CM | POA: Diagnosis not present

## 2016-03-18 DIAGNOSIS — K7689 Other specified diseases of liver: Secondary | ICD-10-CM | POA: Diagnosis not present

## 2016-03-18 DIAGNOSIS — R2681 Unsteadiness on feet: Secondary | ICD-10-CM | POA: Diagnosis not present

## 2016-03-18 DIAGNOSIS — M545 Low back pain: Secondary | ICD-10-CM | POA: Diagnosis not present

## 2016-03-18 DIAGNOSIS — L853 Xerosis cutis: Secondary | ICD-10-CM | POA: Diagnosis not present

## 2016-03-18 DIAGNOSIS — B353 Tinea pedis: Secondary | ICD-10-CM | POA: Diagnosis not present

## 2016-03-18 DIAGNOSIS — S90426A Blister (nonthermal), unspecified lesser toe(s), initial encounter: Secondary | ICD-10-CM | POA: Insufficient documentation

## 2016-03-18 DIAGNOSIS — M6281 Muscle weakness (generalized): Secondary | ICD-10-CM | POA: Diagnosis not present

## 2016-03-18 NOTE — Progress Notes (Signed)
Patient ID: Gina Bolton, female   DOB: 08-Aug-1920, 82 y.o.   MRN: [id number]   Location:  [facility] [phone])  Provider: Jeanmarie Hubert, MD  Code Status: DNR Goals of Care:  Advanced Directives 03/18/2016  Does patient have an advance directive? Yes  Type of Paramedic of [place];Living will;Out of facility DNR (pink MOST or yellow form)  Does patient want to make changes to advanced directive? -  Copy of advanced directive(s) in chart? Yes  Pre-existing out of facility DNR order (yellow form or pink MOST form) Yellow form placed in chart (order not valid for inpatient use)     Chief Complaint  Patient presents with  . Medical Management of Chronic Issues    blood pressure, review labs  . toes    at the end of the day across the top of toes, get red, inflammed, itch for several months.. Here with daughter Hinton Dyer    HPI: Patient is a 82 y.o. female seen today for medical management of chronic diseases.    There is an acute issue of her toes turning red. She gets occasional blisters. There is an intense itching towards the end of the day. This has been a problem of fluctuating intensity. She does have a blister today.  Patient was last seen 02/05/2016. She presented for discussion of an incidental finding of a large liver cyst. She has subsequently seen gastroenterologist, Dr. Laurence Spates, who advised her to see Dr. Stark Klein, general surgery. She saw Dr. Barry Dienes  this morning. Dr. Stark Klein did not feel surgery was necessary at this time.  Other issues discussed includied persistent right-sided discomfort, and the death of her husband.   Past Medical History  Diagnosis Date  . Dysphonia 01/25/2013  . Mild cognitive impairment, so stated 12/02/2011  . Chronic rhinitis 12/02/2011  . Urgency of urination 08/26/2011  . Edema 10/09/2010  . Spinal stenosis in cervical region 09/10/2010  . Closed fracture of second  cervical vertebra without mention of spinal cord injury 09/10/2010  . Urinary frequency 07/30/2010  . Closed fracture of unspecified part of ramus of mandible 06/11/2010  . Cervicalgia 11/20/2009  . Shortness of breath 11/20/2009  . Spinal stenosis, lumbar region, without neurogenic claudication 08/13/2009  . Unspecified hereditary and idiopathic peripheral neuropathy 05/08/2009  . Rosacea 10/24/2008  . Hemangioma of other sites 9//2008  . Other sequelae of chronic liver disease 08/31/2007  . Acquired cyst of kidney 9///2008  . Unspecified constipation 02/23/2007  . Neurogenic bladder, NOS 02/23/2007  . Obstructive sleep apnea (adult) (pediatric) 04/21/2006  . Abnormality of gait 11/11/2005  . Disturbance of skin sensation 11/11/2005  . Unspecified essential hypertension 08/02/2002  . Unspecified hypothyroidism 04/19/2001  . Other and unspecified hyperlipidemia 03/09/2001  . Osteoarthrosis, unspecified whether generalized or localized, unspecified site 03/09/2001  . Senile osteoporosis 03/09/2001  . Hereditary spherocytosis ([place]) 01/14/2000  . Allergic rhinitis, cause unspecified 01/14/2000  . Cramp of limb 01/14/2000  . Personal history of malignant neoplasm of breast 01/14/2000  . Malignant neoplasm of breast (female), unspecified site 05/24/1997    left  . Disorder of bone and cartilage, unspecified 05/04/1995  . Cloudy urine   . History of fall 11/06/2015    11/04/15  . Paresthesia 11/06/2015  . Carpal tunnel syndrome 01/11/2016     Right  . Peripheral neuropathy ([place]) 10/10/2013    Numb feeling in legs reported 09/06/13     Past Surgical History  Procedure Laterality Date  . Tonsillectomy    . Abdominal hysterectomy  1974  . Breast lumpectomy Left 1998    Dr. Coletta Memos  . Bladder repair    . Cataract extraction w/ intraocular lens implant  07/1999    Dr. Bing Plume  . Corneal transplant Left 2002  . Colonoscopy  02/28/1999    no polyps small internal hemorrhoid noted    Allergies  Allergen  Reactions  . Ibuprofen Nausea Only  . Macrobid [Nitrofurantoin Monohyd Macro]     Swelling and flush   . Sulfa Antibiotics       Medication List       This list is accurate as of: 03/18/16 11:56 AM.  Always use your most recent med list.               ALEVE 220 MG tablet  Generic drug:  naproxen sodium  Take 220 mg by mouth. Take two tablets daily as needed for pain     amoxicillin 500 MG tablet  Commonly known as:  AMOXIL  Take 500 mg by mouth. Take one three times daily for pre-dental     Calcium + D3 600-200 MG-UNIT Tabs  Take by mouth. Take one tablet twice daily     citalopram 10 MG tablet  Commonly known as:  CELEXA  TAKE 1 TABLET BY MOUTH DAILY TO HELP WITH ANXIETY AND DEPRESSION     [employer] SPECTRAVITE PO  Take by mouth. One daily for eyes     cycloSPORINE 0.05 % ophthalmic emulsion  Commonly known as:  RESTASIS  1 drop every 12 (twelve) hours. One drop into left eye twice daily     desonide 0.05 % cream  Commonly known as:  DESOWEN  Apply topically 2 (two) times daily. Reported on 02/02/2016     estradiol 0.1 MG/GM vaginal cream  Commonly known as:  ESTRACE  Apply 1 gm to vaginal area 3 times a week as directed.     hydrochlorothiazide 25 MG tablet  Commonly known as:  HYDRODIURIL  TAKE 1 TABLET (25 MG TOTAL) BY MOUTH DAILY. FOR BLOOD PRESSURE     losartan 100 MG tablet  Commonly known as:  COZAAR  One daily to control BP     Magnesium 400 MG Caps  Take by mouth. Take one tablet twice daily for leg cramps     metoprolol 50 MG tablet  Commonly known as:  LOPRESSOR  One daily in morning to control blood pressure     metoprolol 50 MG tablet  Commonly known as:  LOPRESSOR  TAKE 1 TABLET BY MOUTH DAILY FOR BLOOD PRESSURE (TAKE WITH A SNACK)     metroNIDAZOLE 0.75 % gel  Commonly known as:  METROGEL  Apply topically twice daly to help rash     raloxifene 60 MG tablet  Commonly known as:  EVISTA  Take 1 tablet (60 mg total) by mouth daily. To treat  osteoporosis     sodium chloride 5 % ophthalmic solution  Commonly known as:  MURO 128  Place 1 drop into the right eye 2 (two) times daily.     vitamin C 500 MG tablet  Commonly known as:  ASCORBIC ACID  Take 500 mg by mouth daily.     VITAMIN D-400 PO  Take by mouth. Take one daily for vitamin D supplement        Review of Systems:  Review of Systems  Constitutional: Positive for fatigue. Negative for fever, chills, diaphoresis, activity change,  appetite change and unexpected weight change.  HENT: Positive for hearing loss. Negative for congestion, ear pain, mouth sores, rhinorrhea, sinus pressure and sore throat.   Eyes: Positive for visual disturbance (Rx lenses).       Ocular migraine right eye.  Respiratory: Negative for cough, chest tightness, wheezing and stridor.   Cardiovascular: Negative for chest pain, palpitations and leg swelling.  Gastrointestinal: Negative for nausea, abdominal pain, diarrhea, constipation and abdominal distention.       Large Cyst in liver. Patient has seen Dr. Laurence Spates and Dr. Stark Klein. They recommend that we just watch this for the time being.  Endocrine: Negative.  Negative for cold intolerance, heat intolerance and polyuria.  Genitourinary:       Chronic leakage. Has had urologic evaluation. Renal cyst  Musculoskeletal: Positive for back pain, gait problem (unsteady balance) and neck pain. Negative for myalgias, joint swelling, arthralgias and neck stiffness.  Skin:       Chronic venous insufficiency with multiple superficial varicosities. Chronic edema that worsens during the day. Recurrent issues of erythema of the feet, occasional blistering, and pruritus towards the end of the day.  Allergic/Immunologic: Negative.   Neurological: Positive for weakness. Negative for dizziness, tremors, seizures, syncope, facial asymmetry, speech difficulty, light-headedness, numbness and headaches.       Some balance issues. Patient has had 2 falls  in the last 6 weeks. Has noted numbness and some tingling in the feet and lower legs. Fingers and hands seem to be spared. CT scan of the brain in March 2016 showed small vessel disease, but was otherwise normal.  Hematological: Negative.   Psychiatric/Behavioral: Positive for dysphoric mood. The patient is nervous/anxious.        Constant worries about what the future holds for her.    Health Maintenance  Topic Date Due  . ZOSTAVAX  04/14/1982  . DEXA SCAN  04/15/1987  . TETANUS/TDAP  12/22/2008  . INFLUENZA VACCINE  07/22/2016  . PNA vac Low Risk Adult  Completed    Physical Exam: Filed Vitals:   03/18/16 1141  BP: 138/82  Pulse: 60  Temp: 97.8 F (36.6 C)  TempSrc: Oral  Height: 5\' 4"  (1.626 m)  Weight: 134 lb (60.782 kg)  SpO2: 95%   Body mass index is 22.99 kg/(m^2). Physical Exam  Constitutional: She is oriented to person, place, and time. She appears well-developed and well-nourished. No distress.  HENT:  Head: Normocephalic and atraumatic.  Right Ear: External ear normal.  Left Ear: External ear normal.  Nose: Nose normal.  Mouth/Throat: Oropharynx is clear and moist.  Eyes: Conjunctivae and EOM are normal. Pupils are equal, round, and reactive to light.  Corrective lenses.  Neck: Neck supple. No JVD present. No tracheal deviation present. No thyromegaly present.  Cardiovascular: Normal rate, regular rhythm, normal heart sounds and intact distal pulses.  Exam reveals no gallop and no friction rub.   No murmur heard. Pulmonary/Chest: No respiratory distress. She has no wheezes. She has no rales. She exhibits no tenderness.  Abdominal: She exhibits no distension and no mass. There is tenderness. There is guarding.  Tender right upper quadrant and right flank.  Musculoskeletal: Normal range of motion. She exhibits no edema or tenderness.  Hx of Rupture of the head of the right biceps laterally  Lymphadenopathy:    She has no cervical adenopathy.  Neurological: She  is alert and oriented to person, place, and time. She has normal reflexes. No cranial nerve deficit. Coordination normal.  Skin:  No rash noted. No erythema. No pallor.  There is erythema around the great toes. There is blister on the right foot dorsally at the fourth toe. There is cheesy white material between the third and fourth toe on the right foot and also between the fourth and fifth toes on the left foot. Skin overall is quite dry and cracked. There are superficial varicosities and mild swelling of both feet.  Psychiatric: Her behavior is normal. Judgment and thought content normal.  chronic anxiety and mild depression    Labs reviewed: Basic Metabolic Panel:  Recent Labs  11/29/15 02/02/16 1430 03/13/16  NA 135* 128* 137  K 3.8 3.5 4.5  CL  --  94*  --   CO2  --  25  --   GLUCOSE  --  99  --   BUN 11 11 15   CREATININE 0.6 0.54* 0.7  CALCIUM  --  9.1  --   TSH 4.00  --   --    Liver Function Tests:  Recent Labs  07/30/15 11/29/15 11/29/15 1453 02/02/16 1430  AST 25 34  --  28  ALT 15 25  --  24  ALKPHOS 44 91  --  86  BILITOT  --   --   --  0.8  PROT  --   --  6.9 6.7  ALBUMIN  --   --   --  3.5*   No results for input(s): LIPASE, AMYLASE in the last 8760 hours. No results for input(s): AMMONIA in the last 8760 hours. CBC:  Recent Labs  11/29/15 02/02/16 1433  WBC 5.6 10.7*  HGB 12.5 12.0*  HCT 38 34.6*  MCV  --  86.4  PLT 193  --    No results found.  Assessment/Plan  1. Tinea pedis of both feet Apply Lamisil or Tinactin cream to feet daily until healed  2. Edema, unspecified type I previously tried to encourage use of compression stockings. Patient has not been using these because of the problems of the skin on her feet. She seems to be doing fairly well without the compression stockings. Hydrochlorothiazide was discontinued due to low sodium level. Sodium is back to normal. I have elected not to resume the HCTZ.  3. Essential  hypertension Controlled  4. Xerosis cutis Apply Vaseline intensive care with a low or Eucerin or Lubriderm daily following applications of antifungal cream  5. Blister of toe, right, initial encounter Protect the blister until it ruptures and then remove blister skin from the area. She has a topical silver solution that she uses.

## 2016-03-20 DIAGNOSIS — M545 Low back pain: Secondary | ICD-10-CM | POA: Diagnosis not present

## 2016-03-20 DIAGNOSIS — M6281 Muscle weakness (generalized): Secondary | ICD-10-CM | POA: Diagnosis not present

## 2016-03-20 DIAGNOSIS — R2681 Unsteadiness on feet: Secondary | ICD-10-CM | POA: Diagnosis not present

## 2016-03-26 DIAGNOSIS — R2681 Unsteadiness on feet: Secondary | ICD-10-CM | POA: Diagnosis not present

## 2016-03-26 DIAGNOSIS — M545 Low back pain: Secondary | ICD-10-CM | POA: Diagnosis not present

## 2016-03-26 DIAGNOSIS — M6281 Muscle weakness (generalized): Secondary | ICD-10-CM | POA: Diagnosis not present

## 2016-03-28 DIAGNOSIS — M545 Low back pain: Secondary | ICD-10-CM | POA: Diagnosis not present

## 2016-03-28 DIAGNOSIS — M6281 Muscle weakness (generalized): Secondary | ICD-10-CM | POA: Diagnosis not present

## 2016-03-28 DIAGNOSIS — R2681 Unsteadiness on feet: Secondary | ICD-10-CM | POA: Diagnosis not present

## 2016-04-01 DIAGNOSIS — R2681 Unsteadiness on feet: Secondary | ICD-10-CM | POA: Diagnosis not present

## 2016-04-01 DIAGNOSIS — M6281 Muscle weakness (generalized): Secondary | ICD-10-CM | POA: Diagnosis not present

## 2016-04-01 DIAGNOSIS — M545 Low back pain: Secondary | ICD-10-CM | POA: Diagnosis not present

## 2016-04-02 DIAGNOSIS — R2681 Unsteadiness on feet: Secondary | ICD-10-CM | POA: Diagnosis not present

## 2016-04-02 DIAGNOSIS — M545 Low back pain: Secondary | ICD-10-CM | POA: Diagnosis not present

## 2016-04-02 DIAGNOSIS — M6281 Muscle weakness (generalized): Secondary | ICD-10-CM | POA: Diagnosis not present

## 2016-04-04 DIAGNOSIS — M545 Low back pain: Secondary | ICD-10-CM | POA: Diagnosis not present

## 2016-04-04 DIAGNOSIS — R2681 Unsteadiness on feet: Secondary | ICD-10-CM | POA: Diagnosis not present

## 2016-04-04 DIAGNOSIS — M6281 Muscle weakness (generalized): Secondary | ICD-10-CM | POA: Diagnosis not present

## 2016-04-07 DIAGNOSIS — R2681 Unsteadiness on feet: Secondary | ICD-10-CM | POA: Diagnosis not present

## 2016-04-07 DIAGNOSIS — M6281 Muscle weakness (generalized): Secondary | ICD-10-CM | POA: Diagnosis not present

## 2016-04-07 DIAGNOSIS — M545 Low back pain: Secondary | ICD-10-CM | POA: Diagnosis not present

## 2016-04-11 DIAGNOSIS — M6281 Muscle weakness (generalized): Secondary | ICD-10-CM | POA: Diagnosis not present

## 2016-04-11 DIAGNOSIS — R2681 Unsteadiness on feet: Secondary | ICD-10-CM | POA: Diagnosis not present

## 2016-04-11 DIAGNOSIS — M545 Low back pain: Secondary | ICD-10-CM | POA: Diagnosis not present

## 2016-04-14 DIAGNOSIS — M6281 Muscle weakness (generalized): Secondary | ICD-10-CM | POA: Diagnosis not present

## 2016-04-14 DIAGNOSIS — R2681 Unsteadiness on feet: Secondary | ICD-10-CM | POA: Diagnosis not present

## 2016-04-14 DIAGNOSIS — M545 Low back pain: Secondary | ICD-10-CM | POA: Diagnosis not present

## 2016-04-18 DIAGNOSIS — M6281 Muscle weakness (generalized): Secondary | ICD-10-CM | POA: Diagnosis not present

## 2016-04-18 DIAGNOSIS — R2681 Unsteadiness on feet: Secondary | ICD-10-CM | POA: Diagnosis not present

## 2016-04-18 DIAGNOSIS — M545 Low back pain: Secondary | ICD-10-CM | POA: Diagnosis not present

## 2016-04-21 DIAGNOSIS — H10012 Acute follicular conjunctivitis, left eye: Secondary | ICD-10-CM | POA: Diagnosis not present

## 2016-04-22 ENCOUNTER — Encounter: Payer: Self-pay | Admitting: Internal Medicine

## 2016-04-23 DIAGNOSIS — H01004 Unspecified blepharitis left upper eyelid: Secondary | ICD-10-CM | POA: Diagnosis not present

## 2016-04-23 DIAGNOSIS — H01005 Unspecified blepharitis left lower eyelid: Secondary | ICD-10-CM | POA: Diagnosis not present

## 2016-04-23 DIAGNOSIS — H10012 Acute follicular conjunctivitis, left eye: Secondary | ICD-10-CM | POA: Diagnosis not present

## 2016-04-30 DIAGNOSIS — H10012 Acute follicular conjunctivitis, left eye: Secondary | ICD-10-CM | POA: Diagnosis not present

## 2016-04-30 DIAGNOSIS — H01005 Unspecified blepharitis left lower eyelid: Secondary | ICD-10-CM | POA: Diagnosis not present

## 2016-04-30 DIAGNOSIS — H04123 Dry eye syndrome of bilateral lacrimal glands: Secondary | ICD-10-CM | POA: Diagnosis not present

## 2016-04-30 DIAGNOSIS — H01004 Unspecified blepharitis left upper eyelid: Secondary | ICD-10-CM | POA: Diagnosis not present

## 2016-05-16 ENCOUNTER — Ambulatory Visit (INDEPENDENT_AMBULATORY_CARE_PROVIDER_SITE_OTHER): Payer: Medicare Other | Admitting: Neurology

## 2016-05-16 ENCOUNTER — Encounter: Payer: Self-pay | Admitting: Neurology

## 2016-05-16 VITALS — BP 134/82 | HR 60 | Ht 64.0 in | Wt 135.5 lb

## 2016-05-16 DIAGNOSIS — R2681 Unsteadiness on feet: Secondary | ICD-10-CM | POA: Diagnosis not present

## 2016-05-16 DIAGNOSIS — G6289 Other specified polyneuropathies: Secondary | ICD-10-CM | POA: Diagnosis not present

## 2016-05-16 NOTE — Progress Notes (Signed)
Reason for visit: Gait disturbance  Gina Bolton is an 82 y.o. female  History of present illness:  Gina Bolton is a 82 year old right-handed white female with a history of a gait disturbance. The patient has undergone some physical therapy since last seen, she has done well with this, she walks with a cane and she has not had any further falls. Evaluation revealed evidence of a peripheral neuropathy that was the likely etiology of her balance problems. The patient denies any significant discomfort with the neuropathy. Blood work that was done did not show an etiology for the problem. The patient is not on any medications for neuropathic pain. She sleeps well at night. At nighttime when she goes to the bathroom she uses her walker to prevent falls. She has become more safe with ambulation. She returns for further evaluation.  Past Medical History  Diagnosis Date  . Dysphonia 01/25/2013  . Mild cognitive impairment, so stated 12/02/2011  . Chronic rhinitis 12/02/2011  . Urgency of urination 08/26/2011  . Edema 10/09/2010  . Spinal stenosis in cervical region 09/10/2010  . Closed fracture of second cervical vertebra without mention of spinal cord injury 09/10/2010  . Urinary frequency 07/30/2010  . Closed fracture of unspecified part of ramus of mandible 06/11/2010  . Cervicalgia 11/20/2009  . Shortness of breath 11/20/2009  . Spinal stenosis, lumbar region, without neurogenic claudication 08/13/2009  . Unspecified hereditary and idiopathic peripheral neuropathy 05/08/2009  . Rosacea 10/24/2008  . Hemangioma of other sites 9//2008  . Other sequelae of chronic liver disease 08/31/2007  . Acquired cyst of kidney 9///2008  . Unspecified constipation 02/23/2007  . Neurogenic bladder, NOS 02/23/2007  . Obstructive sleep apnea (adult) (pediatric) 04/21/2006  . Abnormality of gait 11/11/2005  . Disturbance of skin sensation 11/11/2005  . Unspecified essential hypertension 08/02/2002  . Unspecified  hypothyroidism 04/19/2001  . Other and unspecified hyperlipidemia 03/09/2001  . Osteoarthrosis, unspecified whether generalized or localized, unspecified site 03/09/2001  . Senile osteoporosis 03/09/2001  . Hereditary spherocytosis ([place]) 01/14/2000  . Allergic rhinitis, cause unspecified 01/14/2000  . Cramp of limb 01/14/2000  . Personal history of malignant neoplasm of breast 01/14/2000  . Malignant neoplasm of breast (female), unspecified site 05/24/1997    left  . Disorder of bone and cartilage, unspecified 05/04/1995  . Cloudy urine   . History of fall 11/06/2015    11/04/15  . Paresthesia 11/06/2015  . Carpal tunnel syndrome 01/11/2016     Right  . Peripheral neuropathy ([place]) 10/10/2013    Numb feeling in legs reported 09/06/13     Past Surgical History  Procedure Laterality Date  . Tonsillectomy    . Abdominal hysterectomy  1974  . Breast lumpectomy Left 1998    Dr. Coletta Memos  . Bladder repair    . Cataract extraction w/ intraocular lens implant  07/1999    Dr. Bing Plume  . Corneal transplant Left 2002  . Colonoscopy  02/28/1999    no polyps small internal hemorrhoid noted    Family History  Problem Relation Age of Onset  . Cancer Father     lung  . Cancer Sister     breast     Social history:  reports that she has never smoked. She has never used smokeless tobacco. She reports that she drinks about 4.2 oz of alcohol per week. She reports that she does not use illicit drugs.    Allergies  Allergen Reactions  . Ibuprofen Nausea Only  . Macrobid [employer]  Monohyd Macro]     Swelling and flush   . Sulfa Antibiotics     Medications:  Prior to Admission medications   Medication Sig Start Date End Date Taking? Authorizing Provider  amoxicillin (AMOXIL) 500 MG tablet Take 500 mg by mouth. Take one three times daily for pre-dental   Yes Historical Provider, MD  Calcium Carb-Cholecalciferol (CALCIUM + D3) 600-200 MG-UNIT TABS Take by mouth. Take one tablet twice daily    Yes Historical Provider, MD  Cholecalciferol (VITAMIN D-400 PO) Take by mouth. Take one daily for vitamin D supplement   Yes Historical Provider, MD  citalopram (CELEXA) 10 MG tablet TAKE 1 TABLET BY MOUTH DAILY TO HELP WITH ANXIETY AND DEPRESSION 02/15/16  Yes Estill Dooms, MD  cycloSPORINE (RESTASIS) 0.05 % ophthalmic emulsion 1 drop every 12 (twelve) hours. One drop into left eye twice daily   Yes Historical Provider, MD  desonide (DESOWEN) 0.05 % cream Apply topically 2 (two) times daily. Reported on 02/02/2016   Yes Historical Provider, MD  estradiol (ESTRACE) 0.1 MG/GM vaginal cream Apply 1 gm to vaginal area 3 times a week as directed. 02/13/15  Yes Estill Dooms, MD  losartan (COZAAR) 100 MG tablet One daily to control BP Patient taking differently: Take 50 mg by mouth daily. One daily to control BP 05/03/15  Yes Estill Dooms, MD  Magnesium 400 MG CAPS Take by mouth. Take one tablet twice daily for leg cramps   Yes Historical Provider, MD  metoprolol (LOPRESSOR) 50 MG tablet One daily in morning to control blood pressure 01/30/15  Yes Estill Dooms, MD  metroNIDAZOLE (METROGEL) 0.75 % gel Apply topically twice daly to help rash 05/01/15  Yes Estill Dooms, MD  Multiple Vitamins-Minerals ([employer]S SPECTRAVITE PO) Take by mouth. One daily for eyes   Yes Historical Provider, MD  naproxen sodium (ALEVE) 220 MG tablet Take 220 mg by mouth. Take two tablets daily as needed for pain   Yes Historical Provider, MD  raloxifene (EVISTA) 60 MG tablet Take 1 tablet (60 mg total) by mouth daily. To treat osteoporosis 12/25/15  Yes Estill Dooms, MD  sodium chloride (MURO 128) 5 % ophthalmic solution Place 1 drop into the right eye 2 (two) times daily.    Yes Historical Provider, MD  vitamin C (ASCORBIC ACID) 500 MG tablet Take 500 mg by mouth daily.   Yes Historical Provider, MD    ROS:  Out of a complete 14 system review of symptoms, the patient complains only of the following symptoms, and all other  reviewed systems are negative.  Daytime sleepiness Sleep apnea on CPAP Incontinence of the bladder Bruising easily Memory loss Decreased concentration, depression  Blood pressure 134/82, pulse 60, height 5\' 4"  (1.626 m), weight 135 lb 8 oz (61.462 kg).  Physical Exam  General: The patient is alert and cooperative at the time of the examination.  Skin: No significant peripheral edema is noted.   Neurologic Exam  Mental status: The patient is alert and oriented x 3 at the time of the examination. The patient has apparent normal recent and remote memory, with an apparently normal attention span and concentration ability.   Cranial nerves: Facial symmetry is present. Speech is normal, no aphasia or dysarthria is noted. Extraocular movements are full. Visual fields are full.  Motor: The patient has good strength in all 4 extremities.  Sensory examination: Soft touch sensation is symmetric on the face, arms, and legs.  Coordination: The patient has good  finger-nose-finger and heel-to-shin bilaterally.  Gait and station: The patient has a slightly wide-based, unsteady gait, the patient uses a cane. Tandem gait was not attempted. Romberg is negative. No drift is seen.  Reflexes: Deep tendon reflexes are symmetric, but are depressed.   Assessment/Plan:  1. Peripheral neuropathy  2. Gait disturbance  The patient is doing well at this time, she does not require medications for neuropathic pain fortunately. She has been safe with her ambulation, she uses a cane to get around. We will see her back on an as-needed basis, she will contact our office if there has been a change in her ability to ambulate.  Jill Alexanders MD 05/16/2016 1:25 PM  [facility] [address] [place], [place] [address]  Phone [phone] Fax [phone]

## 2016-05-16 NOTE — Patient Instructions (Signed)
Fall Prevention in the Home  Falls can cause injuries and can affect people from all age groups. There are many simple things that you can do to make your home safe and to help prevent falls. WHAT CAN I DO ON THE OUTSIDE OF MY HOME?  Regularly repair the edges of walkways and driveways and fix any cracks.  Remove high doorway thresholds.  Trim any shrubbery on the main path into your home.  Use bright outdoor lighting.  Clear walkways of debris and clutter, including tools and rocks.  Regularly check that handrails are securely fastened and in good repair. Both sides of any steps should have handrails.  Install guardrails along the edges of any raised decks or porches.  Have leaves, snow, and ice cleared regularly.  Use sand or salt on walkways during winter months.  In the garage, clean up any spills right away, including grease or oil spills. WHAT CAN I DO IN THE BATHROOM?  Use night lights.  Install grab bars by the toilet and in the tub and shower. Do not use towel bars as grab bars.  Use non-skid mats or decals on the floor of the tub or shower.  If you need to sit down while you are in the shower, use a plastic, non-slip stool..  Keep the floor dry. Immediately clean up any water that spills on the floor.  Remove soap buildup in the tub or shower on a regular basis.  Attach bath mats securely with double-sided non-slip rug tape.  Remove throw rugs and other tripping hazards from the floor. WHAT CAN I DO IN THE BEDROOM?  Use night lights.  Make sure that a bedside light is easy to reach.  Do not use oversized bedding that drapes onto the floor.  Have a firm chair that has side arms to use for getting dressed.  Remove throw rugs and other tripping hazards from the floor. WHAT CAN I DO IN THE KITCHEN?   Clean up any spills right away.  Avoid walking on wet floors.  Place frequently used items in easy-to-reach places.  If you need to reach for something  above you, use a sturdy step stool that has a grab bar.  Keep electrical cables out of the way.  Do not use floor polish or wax that makes floors slippery. If you have to use wax, make sure that it is non-skid floor wax.  Remove throw rugs and other tripping hazards from the floor. WHAT CAN I DO IN THE STAIRWAYS?  Do not leave any items on the stairs.  Make sure that there are handrails on both sides of the stairs. Fix handrails that are broken or loose. Make sure that handrails are as long as the stairways.  Check any carpeting to make sure that it is firmly attached to the stairs. Fix any carpet that is loose or worn.  Avoid having throw rugs at the top or bottom of stairways, or secure the rugs with carpet tape to prevent them from moving.  Make sure that you have a light switch at the top of the stairs and the bottom of the stairs. If you do not have them, have them installed. WHAT ARE SOME OTHER FALL PREVENTION TIPS?  Wear closed-toe shoes that fit well and support your feet. Wear shoes that have rubber soles or low heels.  When you use a stepladder, make sure that it is completely opened and that the sides are firmly locked. Have someone hold the ladder while you   are using it. Do not climb a closed stepladder.  Add color or contrast paint or tape to grab bars and handrails in your home. Place contrasting color strips on the first and last steps.  Use mobility aids as needed, such as canes, walkers, scooters, and crutches.  Turn on lights if it is dark. Replace any light bulbs that burn out.  Set up furniture so that there are clear paths. Keep the furniture in the same spot.  Fix any uneven floor surfaces.  Choose a carpet design that does not hide the edge of steps of a stairway.  Be aware of any and all pets.  Review your medicines with your healthcare provider. Some medicines can cause dizziness or changes in blood pressure, which increase your risk of falling. Talk  with your health care provider about other ways that you can decrease your risk of falls. This may include working with a physical therapist or trainer to improve your strength, balance, and endurance.   This information is not intended to replace advice given to you by your health care provider. Make sure you discuss any questions you have with your health care provider.   Document Released: 11/28/2002 Document Revised: 04/24/2015 Document Reviewed: 01/12/2015 Elsevier Interactive Patient Education 2016 [employer]  

## 2016-05-27 ENCOUNTER — Encounter: Payer: Self-pay | Admitting: Nurse Practitioner

## 2016-05-27 ENCOUNTER — Ambulatory Visit (INDEPENDENT_AMBULATORY_CARE_PROVIDER_SITE_OTHER): Payer: Medicare Other | Admitting: Nurse Practitioner

## 2016-05-27 ENCOUNTER — Ambulatory Visit
Admission: RE | Admit: 2016-05-27 | Discharge: 2016-05-27 | Disposition: A | Discharge status: Home / Self Care | Payer: Medicare Other | Source: Ambulatory Visit | Attending: Nurse Practitioner | Admitting: Nurse Practitioner

## 2016-05-27 VITALS — BP 122/68 | HR 73 | Temp 98.1°F | Resp 17 | Ht 64.0 in | Wt 137.6 lb

## 2016-05-27 DIAGNOSIS — W1809XA Striking against other object with subsequent fall, initial encounter: Secondary | ICD-10-CM | POA: Diagnosis not present

## 2016-05-27 DIAGNOSIS — S39012A Strain of muscle, fascia and tendon of lower back, initial encounter: Secondary | ICD-10-CM

## 2016-05-27 DIAGNOSIS — M4186 Other forms of scoliosis, lumbar region: Secondary | ICD-10-CM | POA: Diagnosis not present

## 2016-05-27 NOTE — Progress Notes (Signed)
Patient ID: Gina Bolton, female   DOB: 12/15/1920, 82 y.o.   MRN: [id number]    PCP: Estill Dooms, MD  Advanced Directive information Does patient have an advance directive?: Yes, Type of Advance Directive: [place];Living will;Out of facility DNR (pink MOST or yellow form)  Allergies  Allergen Reactions  . Ibuprofen Nausea Only  . Macrobid [Nitrofurantoin Monohyd Macro]     Swelling and flush   . Sulfa Antibiotics     Chief Complaint  Patient presents with  . Acute Visit    [place] out of bed on 05-17-16. Scraps/bruises on impact.     HPI: Patient is a 82 y.o. female seen in the office today due to recent fall. Pt with hx of falls and has completed physical therapy due to all of her falls and felt like she was doing good.  On 05/17/16 she went to turn off the alarm clock and rolled out of bed. At the time only had some scraps and bruises but no pain. After 3 days noticed pain in her left buttocks. Has been using heat and tylenol but still hurting. Hurts when she walks and sits down. Once she has settled into her seat pain goes away. No pain when laying down.  Pain is 5-6/10 when she is walking. Sudden movement increases the pain and is sharp Able to walk but does hurt when she moves.    Review of Systems:  Review of Systems  Constitutional: Negative for activity change and appetite change.  HENT: Positive for hearing loss. Negative for congestion, ear pain, mouth sores, rhinorrhea, sinus pressure and sore throat.   Eyes: Positive for visual disturbance (Rx lenses).  Respiratory: Negative for cough, chest tightness, wheezing and stridor.   Cardiovascular: Negative for chest pain, palpitations and leg swelling.  Endocrine: Negative.  Negative for cold intolerance, heat intolerance and polyuria.  Musculoskeletal: Positive for back pain and gait problem (unsteady balance). Negative for myalgias, joint swelling, arthralgias and neck stiffness.  Skin:   Chronic venous insufficiency with multiple superficial varicosities. Chronic edema that worsens during the day. Recurrent issues of erythema of the feet, occasional blistering, and pruritus towards the end of the day.  Allergic/Immunologic: Negative.   Neurological: Positive for weakness. Negative for dizziness, tremors, seizures, syncope, facial asymmetry, speech difficulty, light-headedness, numbness and headaches.       Balance issues with multiple falls   Hematological: Negative.     Past Medical History  Diagnosis Date  . Dysphonia 01/25/2013  . Mild cognitive impairment, so stated 12/02/2011  . Chronic rhinitis 12/02/2011  . Urgency of urination 08/26/2011  . Edema 10/09/2010  . Spinal stenosis in cervical region 09/10/2010  . Closed fracture of second cervical vertebra without mention of spinal cord injury 09/10/2010  . Urinary frequency 07/30/2010  . Closed fracture of unspecified part of ramus of mandible 06/11/2010  . Cervicalgia 11/20/2009  . Shortness of breath 11/20/2009  . Spinal stenosis, lumbar region, without neurogenic claudication 08/13/2009  . Unspecified hereditary and idiopathic peripheral neuropathy 05/08/2009  . Rosacea 10/24/2008  . Hemangioma of other sites 9//2008  . Other sequelae of chronic liver disease 08/31/2007  . Acquired cyst of kidney 9///2008  . Unspecified constipation 02/23/2007  . Neurogenic bladder, NOS 02/23/2007  . Obstructive sleep apnea (adult) (pediatric) 04/21/2006  . Abnormality of gait 11/11/2005  . Disturbance of skin sensation 11/11/2005  . Unspecified essential hypertension 08/02/2002  . Unspecified hypothyroidism 04/19/2001  . Other and unspecified hyperlipidemia 03/09/2001  .  Osteoarthrosis, unspecified whether generalized or localized, unspecified site 03/09/2001  . Senile osteoporosis 03/09/2001  . Hereditary spherocytosis ([place]) 01/14/2000  . Allergic rhinitis, cause unspecified 01/14/2000  . Cramp of limb 01/14/2000  . Personal history of malignant  neoplasm of breast 01/14/2000  . Malignant neoplasm of breast (female), unspecified site 05/24/1997    left  . Disorder of bone and cartilage, unspecified 05/04/1995  . Cloudy urine   . History of fall 11/06/2015    11/04/15  . Paresthesia 11/06/2015  . Carpal tunnel syndrome 01/11/2016     Right  . Peripheral neuropathy ([place]) 10/10/2013    Numb feeling in legs reported 09/06/13    Past Surgical History  Procedure Laterality Date  . Tonsillectomy    . Abdominal hysterectomy  1974  . Breast lumpectomy Left 1998    Dr. Coletta Memos  . Bladder repair    . Cataract extraction w/ intraocular lens implant  07/1999    Dr. Bing Plume  . Corneal transplant Left 2002  . Colonoscopy  02/28/1999    no polyps small internal hemorrhoid noted   Social History:   reports that she has never smoked. She has never used smokeless tobacco. She reports that she drinks about 4.2 oz of alcohol per week. She reports that she does not use illicit drugs.  Family History  Problem Relation Age of Onset  . Cancer Father     lung  . Cancer Sister     breast     Medications: Patient's Medications  New Prescriptions   No medications on file  Previous Medications   CALCIUM CARB-CHOLECALCIFEROL (CALCIUM + D3) 600-200 MG-UNIT TABS    Take by mouth. Take one tablet twice daily   CHOLECALCIFEROL (VITAMIN D-400 PO)    Take by mouth. Take one daily for vitamin D supplement   CITALOPRAM (CELEXA) 10 MG TABLET    TAKE 1 TABLET BY MOUTH DAILY TO HELP WITH ANXIETY AND DEPRESSION   CYCLOSPORINE (RESTASIS) 0.05 % OPHTHALMIC EMULSION    1 drop every 12 (twelve) hours. One drop into left eye twice daily   DESONIDE (DESOWEN) 0.05 % CREAM    Apply topically 2 (two) times daily. Reported on 02/02/2016   ESTRADIOL (ESTRACE) 0.1 MG/GM VAGINAL CREAM    Apply 1 gm to vaginal area 3 times a week as directed.   LOSARTAN (COZAAR) 100 MG TABLET    Take 100 mg by mouth daily.   MAGNESIUM 400 MG CAPS    Take by mouth. Take one tablet twice  daily for leg cramps   METOPROLOL (LOPRESSOR) 50 MG TABLET    One daily in morning to control blood pressure   METRONIDAZOLE (METROGEL) 0.75 % GEL    Apply topically twice daly to help rash   MULTIPLE VITAMINS-MINERALS ([employer] SPECTRAVITE PO)    Take by mouth. One daily for eyes   NAPROXEN SODIUM (ALEVE) 220 MG TABLET    Take 220 mg by mouth. Take two tablets daily as needed for pain   RALOXIFENE (EVISTA) 60 MG TABLET    Take 1 tablet (60 mg total) by mouth daily. To treat osteoporosis   SODIUM CHLORIDE (MURO 128) 5 % OPHTHALMIC SOLUTION    Place 1 drop into the right eye 2 (two) times daily.    VITAMIN C (ASCORBIC ACID) 500 MG TABLET    Take 500 mg by mouth daily.  Modified Medications   No medications on file  Discontinued Medications   AMOXICILLIN (AMOXIL) 500 MG TABLET    Take  500 mg by mouth. Take one three times daily for pre-dental   LOSARTAN (COZAAR) 100 MG TABLET    One daily to control BP     Physical Exam:  Filed Vitals:   05/27/16 1429  BP: 122/68  Pulse: 73  Temp: 98.1 F (36.7 C)  TempSrc: Oral  Resp: 17  Height: 5\' 4"  (1.626 m)  Weight: 137 lb 9.6 oz (62.415 kg)  SpO2: 93%   Body mass index is 23.61 kg/(m^2).  Physical Exam  Constitutional: She is oriented to person, place, and time. She appears well-developed and well-nourished. No distress.  HENT:  Head: Normocephalic and atraumatic.  Mouth/Throat: Oropharynx is clear and moist. No oropharyngeal exudate.  Eyes: Conjunctivae are normal. Pupils are equal, round, and reactive to light.  Cardiovascular: Normal rate, regular rhythm and normal heart sounds.   Pulmonary/Chest: Effort normal and breath sounds normal.  Musculoskeletal: Normal range of motion. She exhibits tenderness (to left paraspinal muscle, no tenderness on spine). She exhibits no edema.  Neurological: She is alert and oriented to person, place, and time.  Skin: Skin is warm and dry. She is not diaphoretic.  Psychiatric: She has a normal mood and  affect.    Labs reviewed: Basic Metabolic Panel:  Recent Labs  11/29/15 02/02/16 1430 03/13/16  NA 135* 128* 137  K 3.8 3.5 4.5  CL  --  94*  --   CO2  --  25  --   GLUCOSE  --  99  --   BUN 11 11 15   CREATININE 0.6 0.54* 0.7  CALCIUM  --  9.1  --   TSH 4.00  --   --    Liver Function Tests:  Recent Labs  07/30/15 11/29/15 11/29/15 1453 02/02/16 1430  AST 25 34  --  28  ALT 15 25  --  24  ALKPHOS 44 91  --  86  BILITOT  --   --   --  0.8  PROT  --   --  6.9 6.7  ALBUMIN  --   --   --  3.5*   No results for input(s): LIPASE, AMYLASE in the last 8760 hours. No results for input(s): AMMONIA in the last 8760 hours. CBC:  Recent Labs  11/29/15 02/02/16 1433  WBC 5.6 10.7*  HGB 12.5 12.0*  HCT 38 34.6*  MCV  --  86.4  PLT 193  --    Lipid Panel: No results for input(s): CHOL, HDL, LDLCALC, TRIG, CHOLHDL, LDLDIRECT in the last 8760 hours. TSH:  Recent Labs  11/29/15  TSH 4.00   A1C: No results found for: HGBA1C   Assessment/Plan 1. Strain of muscle, fascia and tendon of lower back, initial encounter -aleve BID for 7 days then stop,  -may use muscle rub PRN (after heat applied) -heat ~20-30 mins TID  2. Fall against object, initial encounter -hx of multiple falls, completed therapy with good effects however recently with fall will get Lumbar Spine xray to rule out fx   To keep follow up with Dr Nyoka Cowden next week, follow up sooner if needed Yancarlos Berthold K. Harle Battiest  [facility] [phone] 8 am - 5 pm) [phone] (after hours)

## 2016-05-27 NOTE — Patient Instructions (Addendum)
To go to [facility] for xray of lower back Use aleve twice daily for 1 week then STOP Use heating pad three times daily for ~20-30 mins  Follow up if no improvement of worsening of symptoms

## 2016-06-02 ENCOUNTER — Other Ambulatory Visit: Payer: Self-pay

## 2016-06-02 DIAGNOSIS — I10 Essential (primary) hypertension: Secondary | ICD-10-CM | POA: Diagnosis not present

## 2016-06-02 LAB — HEPATIC FUNCTION PANEL
ALT: 19 U/L (ref 7–35)
AST: 26 U/L (ref 13–35)
Alkaline Phosphatase: 119 U/L (ref 25–125)
BILIRUBIN, TOTAL: 0.5 mg/dL

## 2016-06-02 LAB — BASIC METABOLIC PANEL
BUN: 15 mg/dL (ref 4–21)
CREATININE: 0.7 mg/dL (ref 0.5–1.1)
Glucose: 89 mg/dL
Potassium: 4.3 mmol/L (ref 3.4–5.3)
Sodium: 135 mmol/L — AB (ref 137–147)

## 2016-06-03 ENCOUNTER — Encounter: Payer: Self-pay | Admitting: Internal Medicine

## 2016-06-03 ENCOUNTER — Non-Acute Institutional Stay: Payer: Medicare Other | Admitting: Internal Medicine

## 2016-06-03 VITALS — BP 138/88 | HR 58 | Temp 97.4°F | Ht 64.0 in | Wt 138.0 lb

## 2016-06-03 DIAGNOSIS — E039 Hypothyroidism, unspecified: Secondary | ICD-10-CM

## 2016-06-03 DIAGNOSIS — K7689 Other specified diseases of liver: Secondary | ICD-10-CM | POA: Diagnosis not present

## 2016-06-03 DIAGNOSIS — F439 Reaction to severe stress, unspecified: Secondary | ICD-10-CM

## 2016-06-03 DIAGNOSIS — R609 Edema, unspecified: Secondary | ICD-10-CM

## 2016-06-03 DIAGNOSIS — Z9181 History of falling: Secondary | ICD-10-CM

## 2016-06-03 DIAGNOSIS — I10 Essential (primary) hypertension: Secondary | ICD-10-CM | POA: Diagnosis not present

## 2016-06-03 DIAGNOSIS — Z658 Other specified problems related to psychosocial circumstances: Secondary | ICD-10-CM

## 2016-06-03 DIAGNOSIS — R413 Other amnesia: Secondary | ICD-10-CM | POA: Diagnosis not present

## 2016-06-03 NOTE — Progress Notes (Signed)
Patient ID: Gina Bolton, female   DOB: 06-07-1920, 82 y.o.   MRN: [id number]    [facility]     Place of Service: Clinic (12)     Allergies  Allergen Reactions  . Ibuprofen Nausea Only  . Macrobid [Nitrofurantoin Monohyd Macro]     Swelling and flush   . Sulfa Antibiotics     Chief Complaint  Patient presents with  . Medical Management of Chronic Issues    3 month medication management blood pressure, thyroid, labs    HPI:  Seen by Sherrie Mustache, NP on 05/27/2016 for strain of the muscles of the lower back. She had a fall recently and was doing okay with physical therapy, then she fell out of bed and after 3 days noticed pain in her left buttock. She describes pain when walking as 5-6/10. She had x-rays of the lumbar spine completed 05/27/2016. That showed multilevel osteoarthritic changes lumbosacral spine with an S shaped scoliosis. There was an incidental finding of a T12 hemangioma.  Stress - still grieving her husband's death and feeling somewhat depressed  Memory disturbance - needs follow-up MMSE at next visit  Hypothyroidism, unspecified hypothyroidism type - compensated  Essential hypertension -blood pressure was initially high today, but it came back to normal range    Liver cyst - asymptomatic  History of fall - Patient fell from a bed and she has had other falls earlier last year  Edema, unspecified type - Now wearing compression stockings which she can get on. They have helped with the edema.    Medications: Patient's Medications  New Prescriptions   No medications on file  Previous Medications   CALCIUM CARB-CHOLECALCIFEROL (CALCIUM + D3) 600-200 MG-UNIT TABS    Take by mouth. Take one tablet twice daily   CHOLECALCIFEROL (VITAMIN D-400 PO)    Take by mouth. Take one daily for vitamin D supplement   CITALOPRAM (CELEXA) 10 MG TABLET    TAKE 1 TABLET BY MOUTH DAILY TO HELP WITH ANXIETY AND DEPRESSION   CYCLOSPORINE (RESTASIS) 0.05 %  OPHTHALMIC EMULSION    1 drop every 12 (twelve) hours. One drop into left eye twice daily   DESONIDE (DESOWEN) 0.05 % CREAM    Apply topically 2 (two) times daily. Reported on 02/02/2016   ESTRADIOL (ESTRACE) 0.1 MG/GM VAGINAL CREAM    Apply 1 gm to vaginal area 3 times a week as directed.   LOSARTAN (COZAAR) 100 MG TABLET    Take 100 mg by mouth daily.   MAGNESIUM 400 MG CAPS    Take by mouth. Take one tablet twice daily for leg cramps   METOPROLOL (LOPRESSOR) 50 MG TABLET    One daily in morning to control blood pressure   METRONIDAZOLE (METROGEL) 0.75 % GEL    Apply topically twice daly to help rash   MULTIPLE VITAMINS-MINERALS ([employer] SPECTRAVITE PO)    Take by mouth. One daily for eyes   NAPROXEN SODIUM (ALEVE) 220 MG TABLET    Take 220 mg by mouth. Take two tablets daily as needed for pain   RALOXIFENE (EVISTA) 60 MG TABLET    Take 1 tablet (60 mg total) by mouth daily. To treat osteoporosis   SODIUM CHLORIDE (MURO 128) 5 % OPHTHALMIC SOLUTION    Place 1 drop into the right eye 2 (two) times daily.    VITAMIN C (ASCORBIC ACID) 500 MG TABLET    Take 500 mg by mouth daily.  Modified Medications   No medications on file  Discontinued Medications   No medications on file     Review of Systems  Constitutional: Positive for fatigue. Negative for fever, chills, diaphoresis, activity change, appetite change and unexpected weight change.  HENT: Positive for hearing loss. Negative for congestion, ear pain, mouth sores, rhinorrhea, sinus pressure and sore throat.   Eyes: Positive for visual disturbance (Rx lenses).       Ocular migraine right eye.  Respiratory: Negative for cough, chest tightness, wheezing and stridor.   Cardiovascular: Negative for chest pain, palpitations and leg swelling.  Gastrointestinal: Negative for nausea, abdominal pain, diarrhea, constipation and abdominal distention.       Large Cyst in liver. Patient has seen Dr. Laurence Spates and Dr. Stark Klein. They recommend that  we just watch this for the time being.  Endocrine: Negative.  Negative for cold intolerance, heat intolerance and polyuria.  Genitourinary:       Chronic leakage. Has had urologic evaluation. Renal cyst  Musculoskeletal: Positive for back pain, gait problem (unsteady balance) and neck pain. Negative for myalgias, joint swelling, arthralgias and neck stiffness.  Skin:       Chronic venous insufficiency with multiple superficial varicosities. Chronic edema that worsens during the day. Recurrent issues of erythema of the feet, occasional blistering, and pruritus towards the end of the day.  Allergic/Immunologic: Negative.   Neurological: Positive for weakness. Negative for dizziness, tremors, seizures, syncope, facial asymmetry, speech difficulty, light-headedness, numbness and headaches.       Some balance issues. Patient has had 2 falls in the last 6 weeks. Has noted numbness and some tingling in the feet and lower legs. Fingers and hands seem to be spared. CT scan of the brain in March 2016 showed small vessel disease, but was otherwise normal.  Hematological: Negative.   Psychiatric/Behavioral: Positive for dysphoric mood. The patient is nervous/anxious.        Constant worries about what the future holds for her.    Filed Vitals:   06/03/16 1121 06/03/16 1211  BP: 172/100 138/88  Pulse: 58   Temp: 97.4 F (36.3 C)   TempSrc: Oral   Height: '5\' 4"'$  (1.626 m)   Weight: 138 lb (62.596 kg)   SpO2: 99%    Wt Readings from Last 3 Encounters:  06/03/16 138 lb (62.596 kg)  05/27/16 137 lb 9.6 oz (62.415 kg)  05/16/16 135 lb 8 oz (61.462 kg)    Body mass index is 23.68 kg/(m^2).  Physical Exam  Constitutional: She is oriented to person, place, and time. She appears well-developed and well-nourished. No distress.  HENT:  Head: Normocephalic and atraumatic.  Right Ear: External ear normal.  Left Ear: External ear normal.  Nose: Nose normal.  Mouth/Throat: Oropharynx is clear and  moist.  Eyes: Conjunctivae and EOM are normal. Pupils are equal, round, and reactive to light.  Corrective lenses.  Neck: Neck supple. No JVD present. No tracheal deviation present. No thyromegaly present.  Cardiovascular: Normal rate, regular rhythm, normal heart sounds and intact distal pulses.  Exam reveals no gallop and no friction rub.   No murmur heard. Pulmonary/Chest: No respiratory distress. She has no wheezes. She has no rales. She exhibits no tenderness.  Abdominal: She exhibits no distension and no mass. There is tenderness. There is guarding.  Tender right upper quadrant and right flank.  Musculoskeletal: Normal range of motion. She exhibits no edema or tenderness.  Hx of Rupture of the head of the right biceps laterally  Lymphadenopathy:    She has no  cervical adenopathy.  Neurological: She is alert and oriented to person, place, and time. She has normal reflexes. No cranial nerve deficit. Coordination normal.  Skin: No rash noted. No erythema. No pallor.  There are superficial varicosities and mild swelling of both feet.  Psychiatric: Her behavior is normal. Judgment and thought content normal.  chronic anxiety and mild depression     Labs reviewed: Lab Summary Latest Ref Rng 06/02/2016 03/13/2016 02/02/2016 11/29/2015  Hemoglobin 12.2 - 16.2 g/dL (None) (None) 12.0(A) 12.5  Hematocrit 37.7 - 47.9 % (None) (None) 34.6(A) 38  White count 4.6 - 10.2 K/uL (None) (None) 10.7(A) 5.6  Platelet count 150 - 399 K/L (None) (None) (None) 193  Sodium 137 - 147 mmol/L 135(A) 137 128(L) 135(A)  Potassium 3.4 - 5.3 mmol/L 4.3 4.5 3.5 3.8  Calcium 8.6 - 10.4 mg/dL (None) (None) 9.1 (None)  Phosphorus - (None) (None) (None) (None)  Creatinine 0.5 - 1.1 mg/dL 0.7 0.7 0.54(L) 0.6  AST 13 - 35 U/L 26 (None) 28 34  Alk Phos 25 - 125 U/L 119 (None) 86 91  Bilirubin 0.2 - 1.2 mg/dL (None) (None) 0.8 (None)  Glucose - 89 86 99 96  Cholesterol - (None) (None) (None) (None)  HDL cholesterol -  (None) (None) (None) (None)  Triglycerides - (None) (None) (None) (None)  LDL Direct - (None) (None) (None) (None)  LDL Calc - (None) (None) (None) (None)  Total protein 6.1 - 8.1 g/dL (None) (None) 6.7 (None)  Albumin 3.6 - 5.1 g/dL (None) (None) 3.5(L) (None)   Lab Results  Component Value Date   TSH 4.00 11/29/2015   Lab Results  Component Value Date   BUN 15 06/02/2016   BUN 15 03/13/2016   BUN 11 02/02/2016   Lab Results  Component Value Date   CREATININE 0.7 06/02/2016   CREATININE 0.7 03/13/2016   CREATININE 0.54* 02/02/2016   No results found for: HGBA1C  Dg Lumbar Spine Complete  05/27/2016  CLINICAL DATA:  Left buttock pain radiating down the left leg. History of breast cancer. EXAM: LUMBAR SPINE - COMPLETE 4+ VIEW COMPARISON:  CT of the abdomen pelvis 02/02/2016 FINDINGS: There is no evidence of lumbar spine fracture. There is mild S shaped lumbosacral spine scoliosis with advanced multilevel osteoarthritic changes of the lumbosacral spine including disc space narrowing, degenerative vertebral body remodeling, endplate sclerosis and advanced posterior facet arthropathy. These changes are more severe in the lower lumbosacral spine. Incidental note is made of T12 hemangioma. IMPRESSION: Multilevel osteoarthritic changes of the lumbosacral spine with S-shaped scoliosis. Electronically Signed   By: Fidela Salisbury M.D.   On: 05/27/2016 19:23     Assessment/Plan  1. Stress Continue Celexa. She mentions that she thinks is making her drowsy at a level she has not previously experienced  Patient brought the results of her husband's autopsy. Approximately 15 minutes was spent discussing these results. Autopsy was only of the brain. Neurofibrillary tangles and plaque deposition consistent with Alzheimer's disease was present. There are also some microinfarcts in the frontal lobes suggestive of vascular disease.  2. Memory disturbance MMSE next visit  3. Hypothyroidism,  unspecified hypothyroidism type Compensated  4. Essential hypertension Get a blood pressure check every week or 2 at [facility]  5. Liver cyst Continue to monitor  6. History of fall Discussed fall prevention  7. Edema, unspecified type Continue compression stockings

## 2016-06-04 DIAGNOSIS — H52221 Regular astigmatism, right eye: Secondary | ICD-10-CM | POA: Diagnosis not present

## 2016-06-04 DIAGNOSIS — H1851 Endothelial corneal dystrophy: Secondary | ICD-10-CM | POA: Diagnosis not present

## 2016-06-04 DIAGNOSIS — H524 Presbyopia: Secondary | ICD-10-CM | POA: Diagnosis not present

## 2016-06-04 DIAGNOSIS — H01005 Unspecified blepharitis left lower eyelid: Secondary | ICD-10-CM | POA: Diagnosis not present

## 2016-06-04 DIAGNOSIS — Z947 Corneal transplant status: Secondary | ICD-10-CM | POA: Diagnosis not present

## 2016-06-04 DIAGNOSIS — H01004 Unspecified blepharitis left upper eyelid: Secondary | ICD-10-CM | POA: Diagnosis not present

## 2016-06-10 ENCOUNTER — Encounter: Payer: Self-pay | Admitting: Internal Medicine

## 2016-06-17 ENCOUNTER — Other Ambulatory Visit: Payer: Self-pay

## 2016-06-17 MED ORDER — ESTRADIOL 0.1 MG/GM VA CREA: TOPICAL_CREAM | VAGINAL | Status: AC

## 2016-06-17 NOTE — Telephone Encounter (Signed)
Patient came by clinic asking for refill. Pharmacy had called the office and was told it had been d/c, it had not be d/c. Refilled for patient.

## 2016-06-18 ENCOUNTER — Telehealth: Payer: Self-pay

## 2016-06-18 NOTE — Telephone Encounter (Signed)
Patient called, having trouble getting the Estradiol cream. The Rx we faxed to [facility], they can't take, it has to go to a regular pharmacy. It would cost more. Called [facility], the prescription from before was Estradiol 0.01%mg/ml. lst prescription from Dr. Jeffie Pollock 10/12/09, then Dr. Nyoka Cowden took over. The only difference he is able to mix up the prescription if it just says Gina Bolton, the prescription we faxed over had Estradiol and (Estrace) written on it, he could take a verbal order for just Estradiol. Put them on hold asked Dr. Nyoka Cowden, ok with him. Called patient back to let her know that [facility] will be able to make her medication.

## 2016-06-28 ENCOUNTER — Other Ambulatory Visit: Payer: Self-pay | Admitting: Internal Medicine

## 2016-07-22 ENCOUNTER — Other Ambulatory Visit: Payer: Self-pay

## 2016-07-22 MED ORDER — RALOXIFENE HCL 60 MG PO TABS: 60.0000 mg | ORAL_TABLET | Freq: Every day | ORAL | 3 refills | Status: DC

## 2016-07-22 NOTE — Telephone Encounter (Signed)
Refill request came in from [email or web address] (Fax [phone]) for Evista 60 mg tablets. Take 1 pill daily to treat osteoporosis. 3 month supply with 3 refills.   Form was placed in Dr. Rolly Salter folder for signing and fax. Prescription was documented in patient's chart as a refill.

## 2016-08-04 IMAGING — CR DG ABDOMEN ACUTE W/ 1V CHEST
3 series · 3 of 3 positions shown · non-contrast
Comparison: None.

CLINICAL DATA: [AGE] female with abdominal pain and
distention.

EXAM:
DG ABDOMEN ACUTE W/ 1V CHEST

[PA]
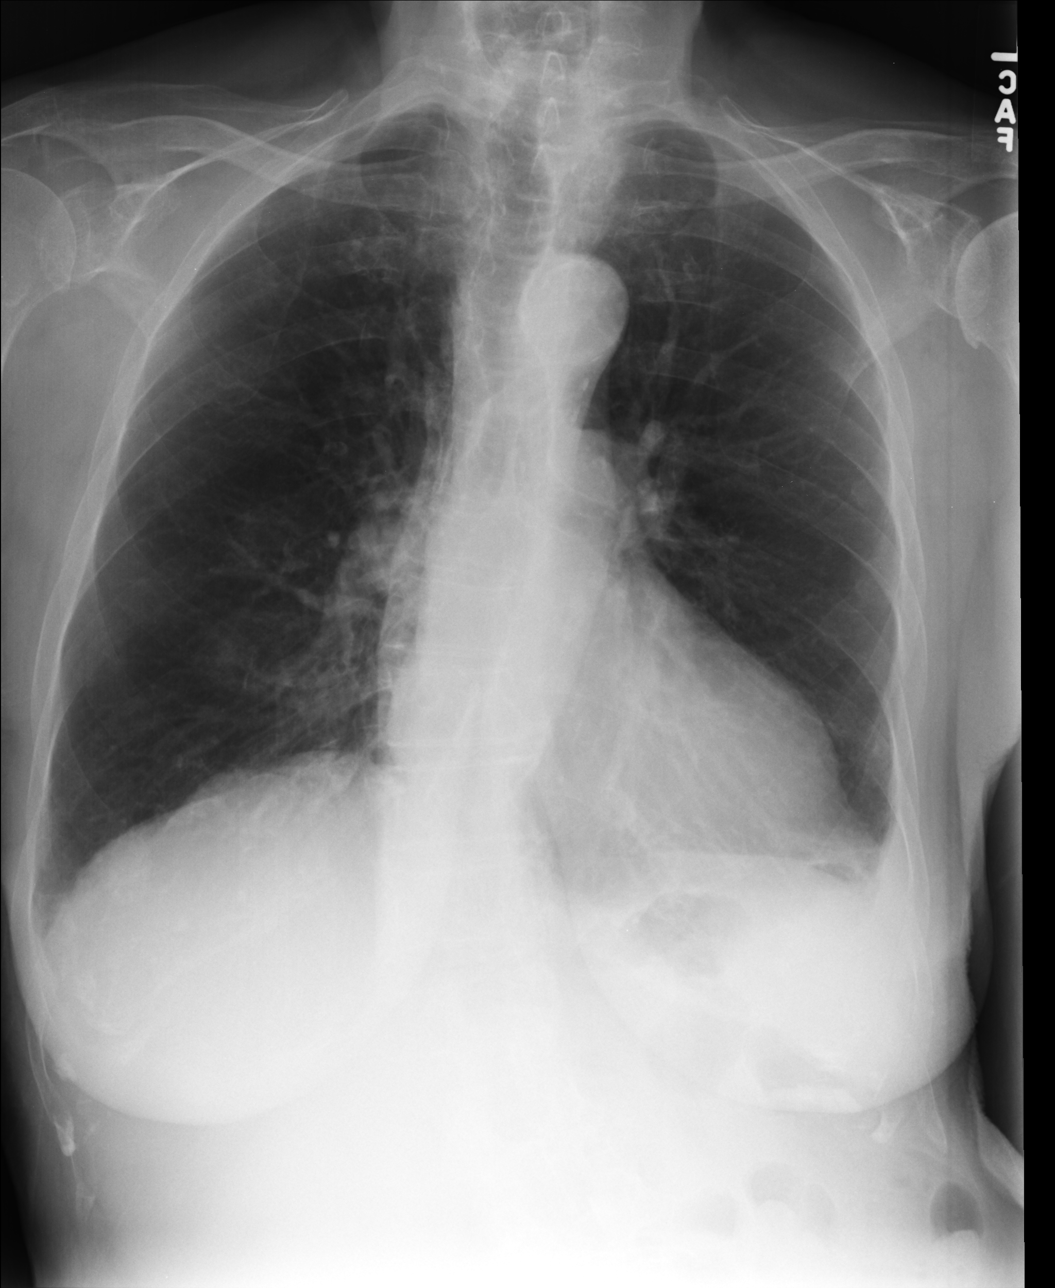

[AP (1 of 2)]
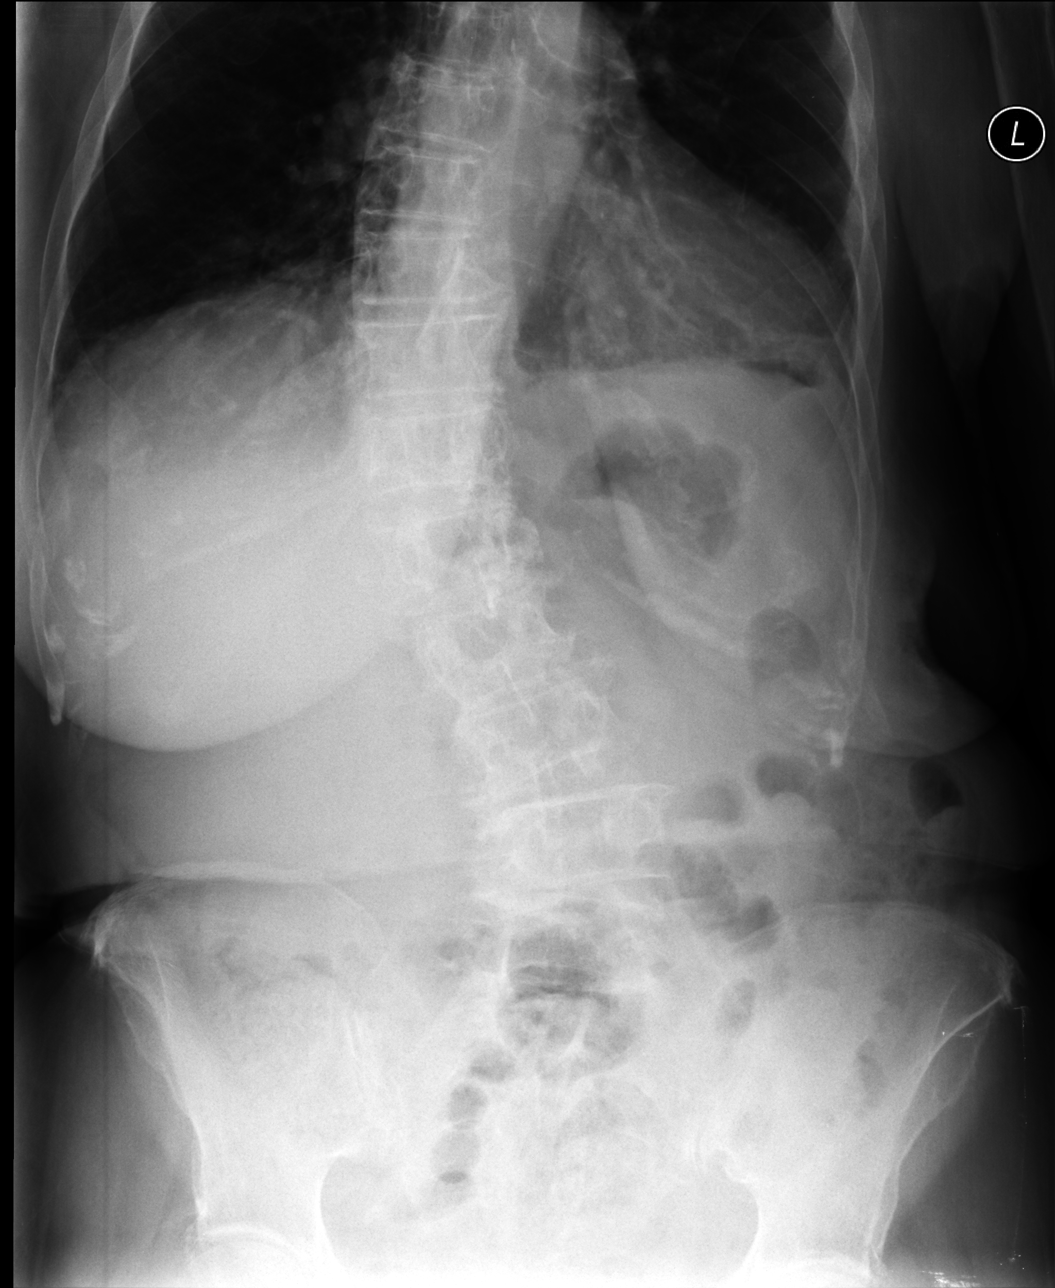

[AP (2 of 2)]
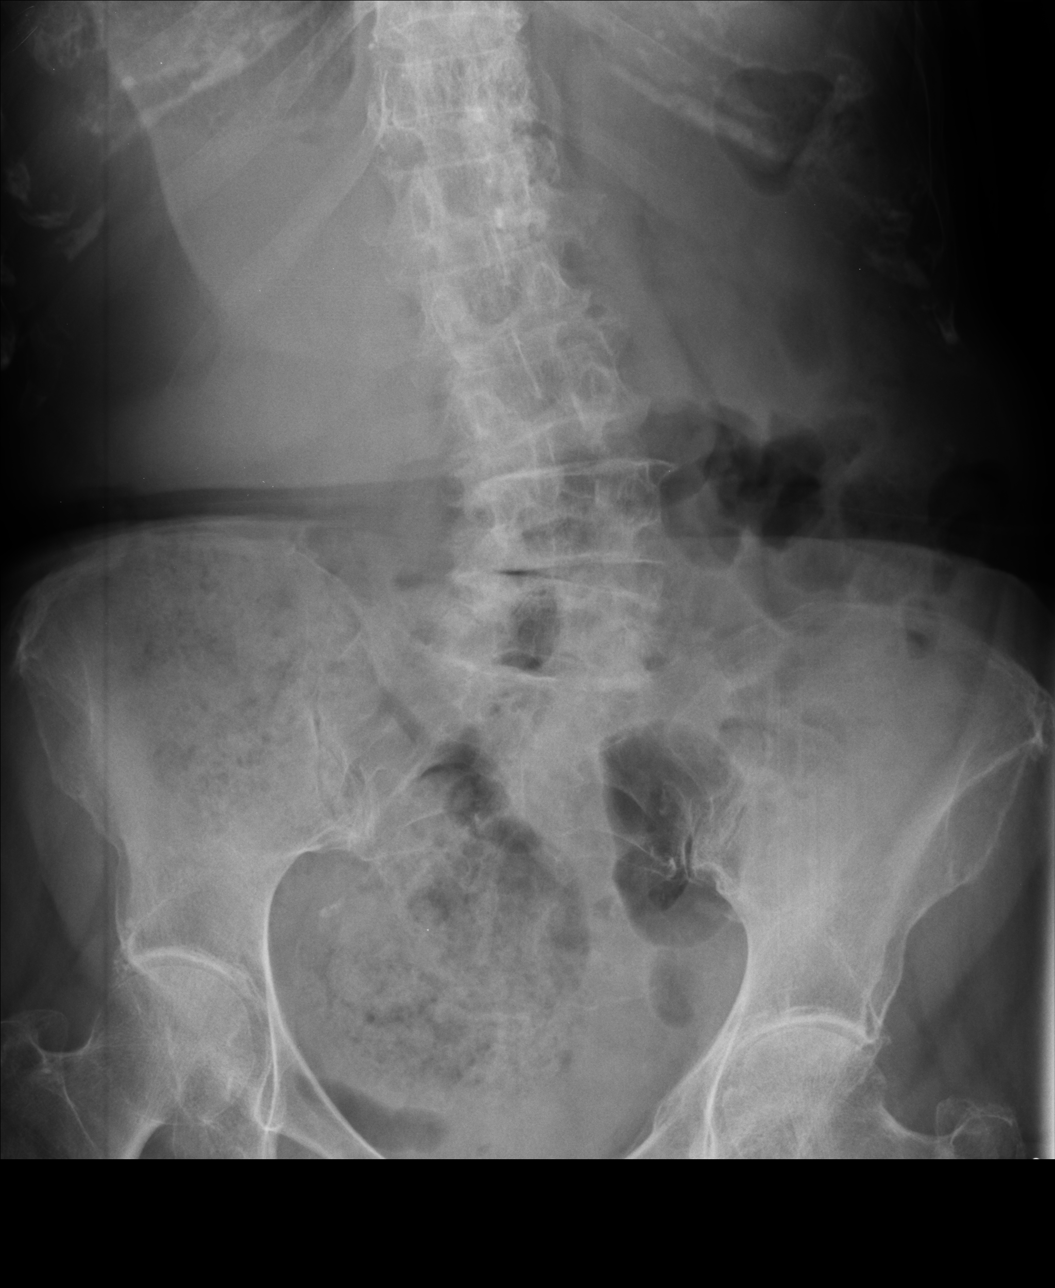

[3 of 3 positions shown; findings below may reference images not displayed]

FINDINGS: The cardiomediastinal silhouette is unremarkable.

A trace left pleural effusion versus pleural thickening noted.

There is no evidence of airspace disease, pleural effusion or mass.

The bowel gas pattern is unremarkable.

There is no evidence of bowel obstruction or pneumoperitoneum.

No suspicious calcifications are present.

A moderate amount of stool in the ascending colon/rectum noted.
IMPRESSION: No evidence of bowel obstruction or pneumoperitoneum. Moderate
amount of stool within the ascending colon/rectum.

Trace left pleural effusion versus pleural thickening.

## 2016-08-04 IMAGING — CT CT ABD-PELV W/ CM
2 of 5 series · 15 of 46 positions shown, 17 images · IV contrast (omnipaque)
Comparison: Lumbar spine MRI dated 09/11/2009

CLINICAL DATA: Recurrent right abdominal pain x2 weeks. Neurogenic
bladder with prior bladder repair. Status post hysterectomy.

EXAM:
CT ABDOMEN AND PELVIS WITH CONTRAST
TECHNIQUE: Multidetector CT imaging of the abdomen and pelvis was performed
using the standard protocol following bolus administration of
intravenous contrast.
CONTRAST:  100mL OMNIPAQUE IOHEXOL 300 MG/ML  SOLN

[Series 2: abd/pel with · axial · 0.77mm/px · z∈[-473,-108]mm · 12 of 85 slices shown, 14 images]
[im 6/85  soft-tissue]
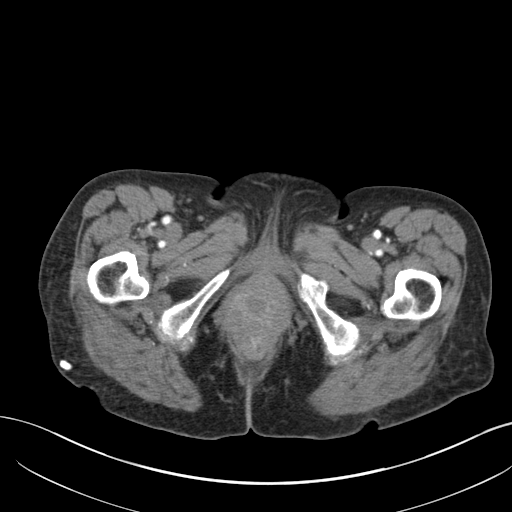
[im 6/85  bone]
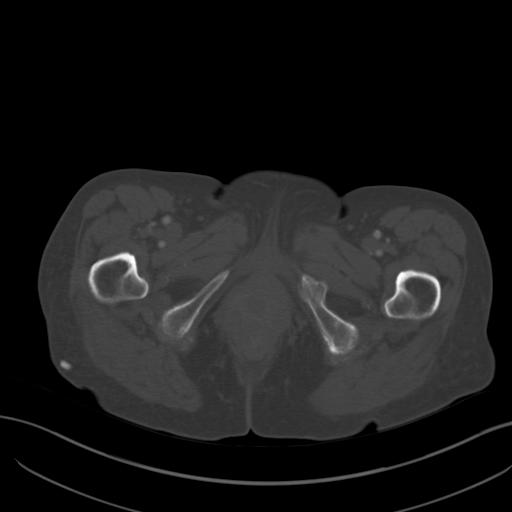
[im 11/85  soft-tissue]
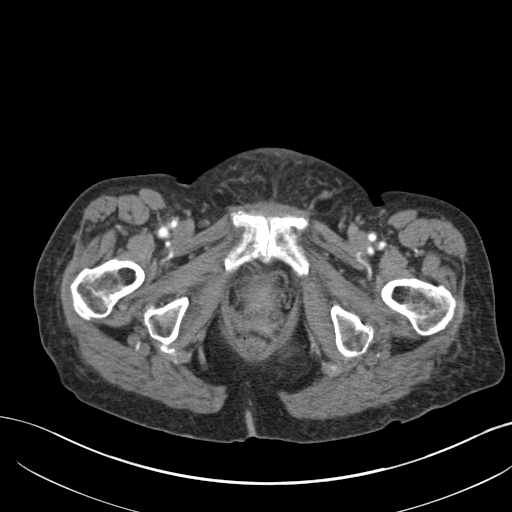
[im 22/85  soft-tissue]
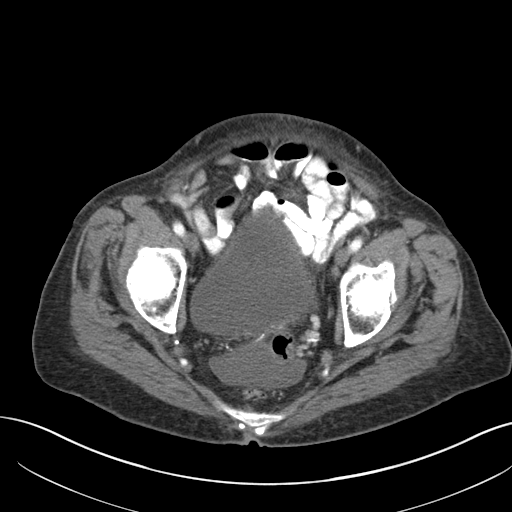
[im 27/85  soft-tissue]
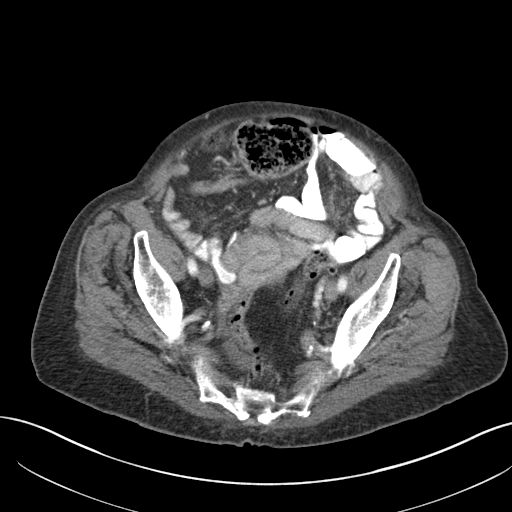
[im 32/85  soft-tissue]
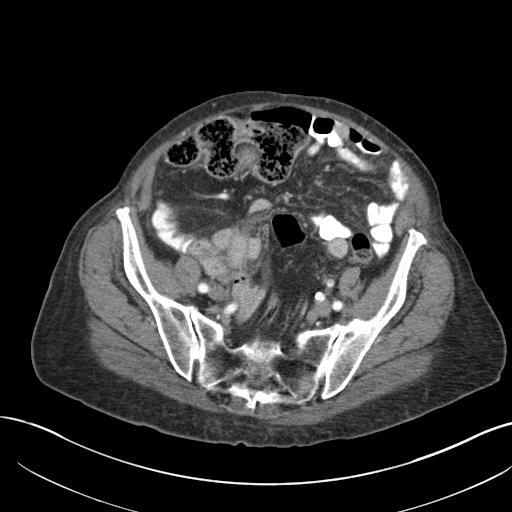
[im 37/85  soft-tissue]
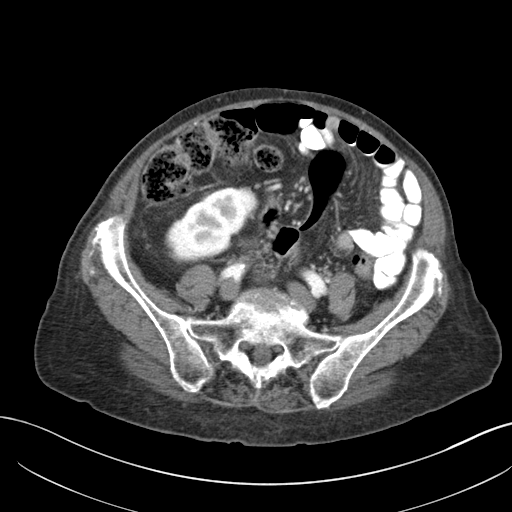
[im 48/85  soft-tissue]
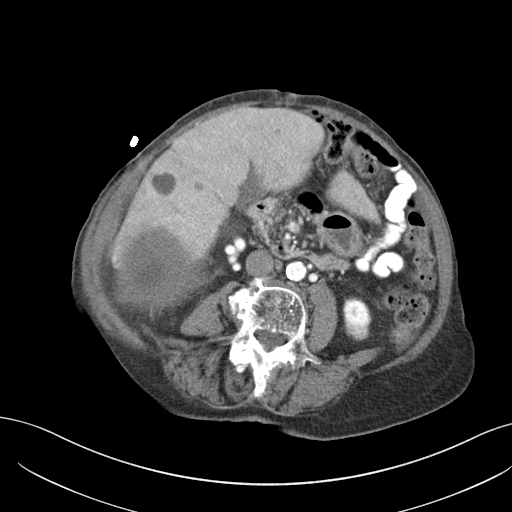
[im 53/85  soft-tissue]
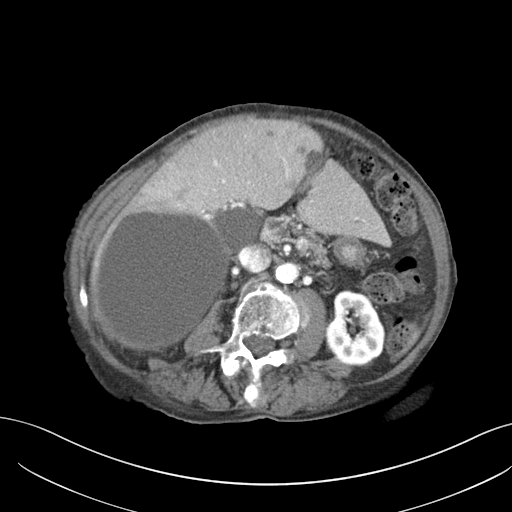
[im 58/85  soft-tissue]
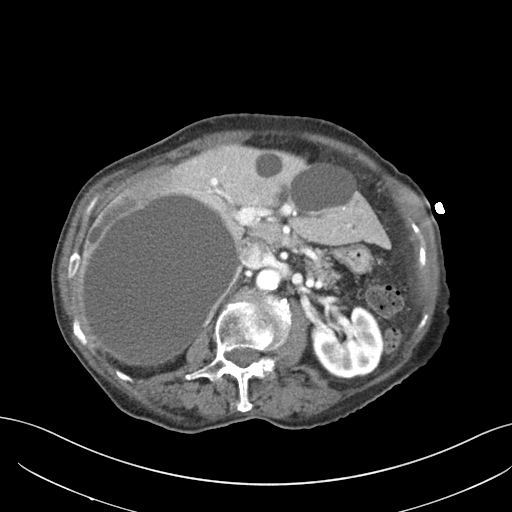
[im 58/85  bone]
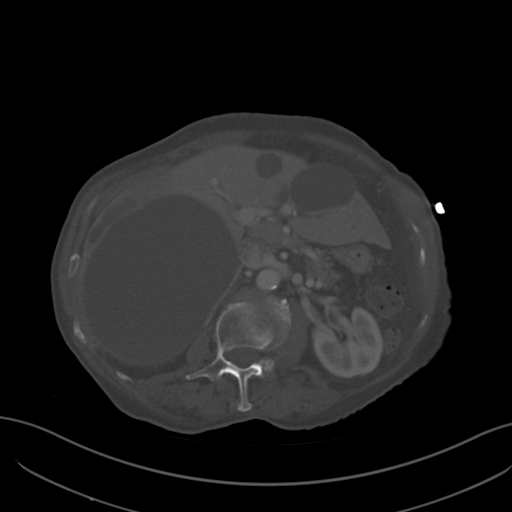
[im 64/85  soft-tissue]
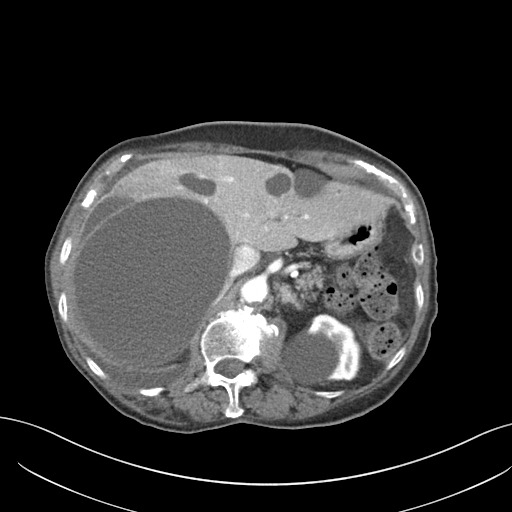
[im 74/85  soft-tissue]
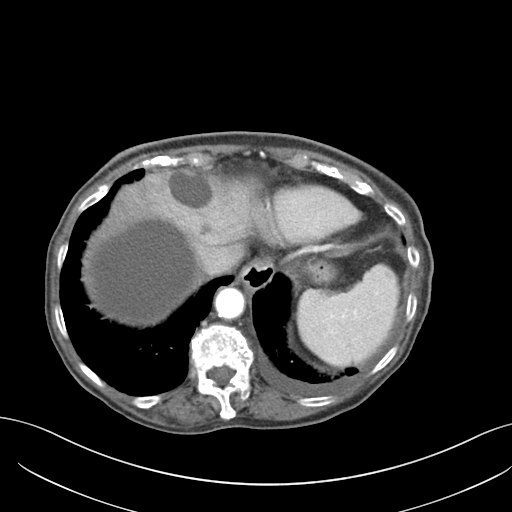
[im 79/85  soft-tissue]
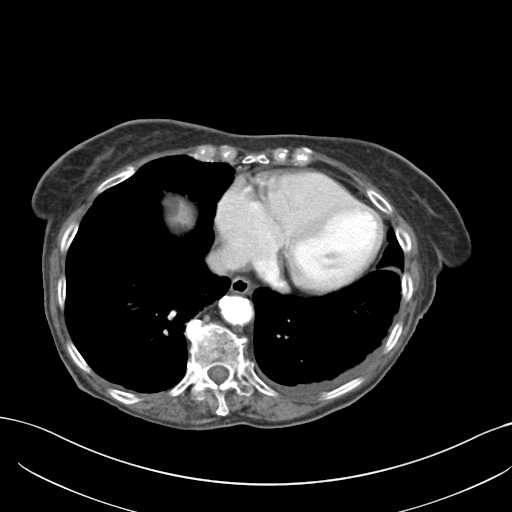

[Series 6: coronal a/|p · coronal · 0.67mm/px · 3 of 109 slices shown]
[im 37/109  soft-tissue]
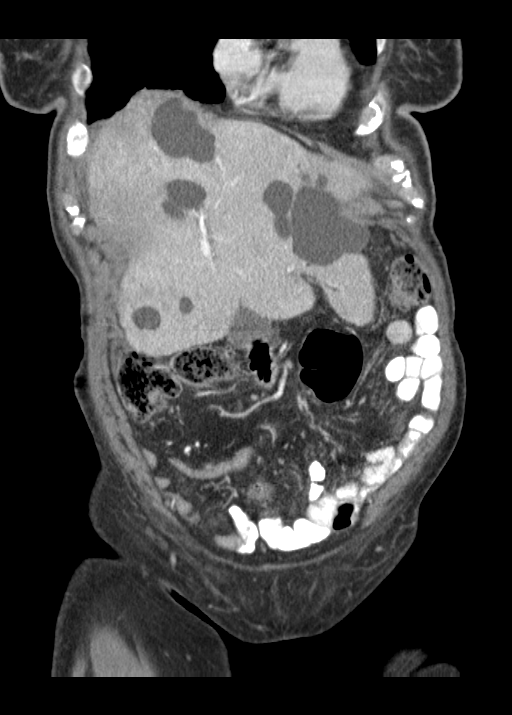
[im 49/109  soft-tissue]
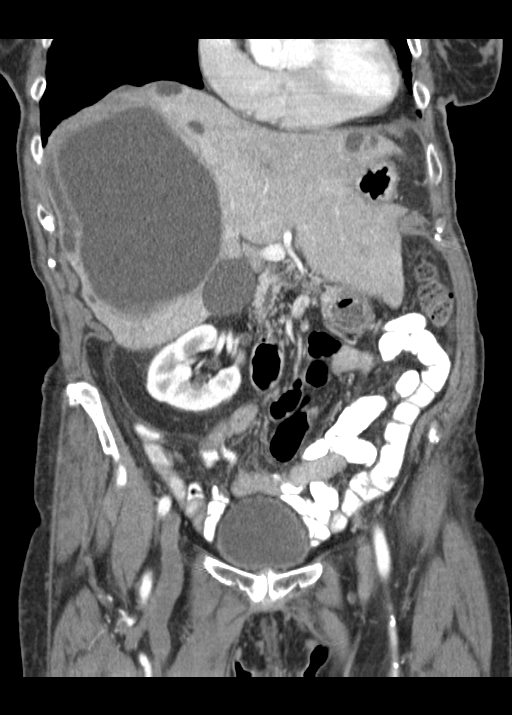
[im 61/109  soft-tissue]
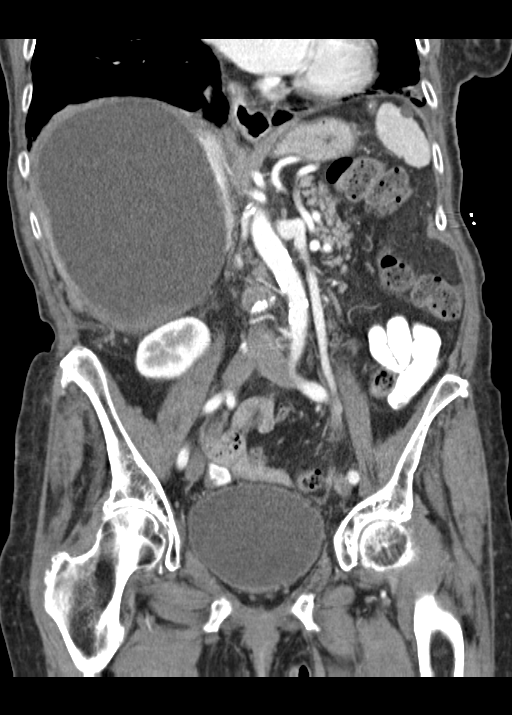

[15 of 46 positions shown; findings below may reference images not displayed]

FINDINGS: Lower chest: Small left and trace right all pleural effusions.
Associated mild left lower lobe opacity, likely atelectasis.

Hepatobiliary: Dominant 11.3 x 13.9 x 15.7 cm cyst in the posterior
right hepatic lobe (series 2/ image 24) demonstrates a mildly
thickened rim and layering debris/hemorrhage (coronal image 57).
Associated mass effect on the hepatic capsule and adjacent
structures in the right upper quadrant. Lesion was incompletely
visualized but measures at least 7.2 x 5.2 cm in 7242.

Additional scattered hepatic cysts measuring up to 5.2 cm. 10 mm
probable flash filling hemangioma inferiorly in the anterior segment
right hepatic lobe (series 2/ image 40).

Gallbladder is within normal limits. No intrahepatic or extrahepatic
ductal dilatation.

Pancreas: Pancreatic atrophy.

Spleen: 10 mm cyst along the anterior spleen (series 2/image 10).

Adrenals/Urinary Tract: Left adrenal gland is within normal limits.
Right adrenal gland is poorly visualized due to extrinsic
compression (series 2/ image 26), but likely within normal limits.

5.6 cm left upper pole renal cyst (series 2/ image 19). Right kidney
is malrotated/ displaced but within normal limits. No
hydronephrosis.

Bladder is notable for a tiny focus of nondependent gas (series 2/
image 68), correlate for intervention.

Stomach/Bowel: Stomach is notable for a tiny hiatal hernia.

No evidence of bowel obstruction.

Appendix is not discretely visualized.

Colonic diverticulosis, without evidence of diverticulitis.

Vascular/Lymphatic: Atherosclerotic calcifications of the abdominal
aorta and branch vessels.

No evidence of abdominal aortic aneurysm.

No suspicious abdominopelvic lymphadenopathy.

Reproductive: Status post hysterectomy.

No adnexal masses.

Other: Small volume pelvic ascites.

Tiny fat containing bilateral inguinal hernias (series 2/image 34).

Musculoskeletal: Moderate degenerative changes of the visualized
thoracolumbar spine.

Lumbar levoscoliosis.  Vertebral hemangioma at T12.
IMPRESSION: Dominant 15.7 cm thick-walled hepatic cyst in the posterior right
hepatic lobe with layering debris/hemorrhage. Associated mass effect
on the hepatic capsule, favored to be the cause of the patient's
symptoms. This cyst was incompletely visualized in 7242, measuring
at least 7.2 cm at that time.

Additional scattered hepatic cysts measuring up to 5.2 cm, but
without superimposed inflammatory changes or hemorrhage.

No evidence of bowel obstruction.

Additional ancillary findings as above.

## 2016-08-09 ENCOUNTER — Other Ambulatory Visit: Payer: Self-pay | Admitting: Internal Medicine

## 2016-08-26 ENCOUNTER — Non-Acute Institutional Stay: Payer: Medicare Other | Admitting: Internal Medicine

## 2016-08-26 ENCOUNTER — Encounter: Payer: Self-pay | Admitting: Internal Medicine

## 2016-08-26 VITALS — BP 146/84 | HR 57 | Temp 97.4°F | Ht 64.0 in | Wt 137.0 lb

## 2016-08-26 DIAGNOSIS — R2681 Unsteadiness on feet: Secondary | ICD-10-CM | POA: Diagnosis not present

## 2016-08-26 DIAGNOSIS — R32 Unspecified urinary incontinence: Secondary | ICD-10-CM

## 2016-08-26 DIAGNOSIS — R413 Other amnesia: Secondary | ICD-10-CM | POA: Diagnosis not present

## 2016-08-26 DIAGNOSIS — K7689 Other specified diseases of liver: Secondary | ICD-10-CM | POA: Diagnosis not present

## 2016-08-26 DIAGNOSIS — R21 Rash and other nonspecific skin eruption: Secondary | ICD-10-CM | POA: Diagnosis not present

## 2016-08-26 DIAGNOSIS — K5900 Constipation, unspecified: Secondary | ICD-10-CM

## 2016-08-26 DIAGNOSIS — F4323 Adjustment disorder with mixed anxiety and depressed mood: Secondary | ICD-10-CM

## 2016-08-26 DIAGNOSIS — K5901 Slow transit constipation: Secondary | ICD-10-CM

## 2016-08-26 DIAGNOSIS — I10 Essential (primary) hypertension: Secondary | ICD-10-CM

## 2016-08-26 DIAGNOSIS — E039 Hypothyroidism, unspecified: Secondary | ICD-10-CM

## 2016-08-26 HISTORY — DX: Constipation, unspecified: K59.00

## 2016-08-26 HISTORY — DX: Unspecified urinary incontinence: R32

## 2016-08-26 NOTE — Progress Notes (Signed)
Facility  [facility]    Place of Service: Clinic (12)     Allergies  Allergen Reactions  . Ibuprofen Nausea Only  . Macrobid [Nitrofurantoin Monohyd Macro]     Swelling and flush   . Sulfa Antibiotics     Chief Complaint  Patient presents with  . Medical Management of Chronic Issues    2 month medication management memory, blood pressure, thyroid  . Other    MMSE 28/30 passed clock drawing    HPI:  Last seen 06/03/2016. Patient seemed to be stable at that time. Mr. Stress and continuation of Celexa to help that. She also had the results of her husband's autopsy presented and discussed. Blood pressure is elevated at 172/100.patient has a large liver cyst is asymptomatic. She has seen both surgeon and gastroenterologist.  Memory disturbance -  It is not functionally interfering with her life, however there have been some changes. Her daughter is now doing all of her finances and check writing. [place] she does continue to drive in the  [place].   Essential hypertension - controlled  Hypothyroidism, unspecified hypothyroidism type - stable  Unstable gait -  She has noted increasing unsteadiness when walking. She is using a walker most times. This problem seems to be coincidental with her memory losses.  Adjustment disorder with mixed anxiety and depressed mood -  Patient is doing relatively well since her husband's death. She is feeling lonely. She is not having overtly tearful episodes although she does feel sad.  Liver cyst -  Chronic issue. She is asymptomatic. Denies nausea or abdominal discomfort.  Urinary incontinence, unspecified incontinence type -  Patient has had urinary urgency for the last couple of years. Recently, she has had incontinence at night. This usually occurs around 4:00 in the morning. She says she just can't get to the bathroom in time. She wears a pad for protection. She denies flooding.  Slow transit constipation - She is  eating prunes. She uses Colace. Stools are firm.  Rash and nonspecific skin eruption -  Reddish discoloration of the dorsum of the forefoot in the interdigital space as well as the distal forefoot. She denies itching or burning feelings.    Medications: Patient's Medications  New Prescriptions   No medications on file  Previous Medications   CALCIUM CARB-CHOLECALCIFEROL (CALCIUM + D3) 600-200 MG-UNIT TABS    Take by mouth. Take one tablet twice daily   CHOLECALCIFEROL (VITAMIN D-400 PO)    Take by mouth. Take one daily for vitamin D supplement   CITALOPRAM (CELEXA) 10 MG TABLET    TAKE 1 TABLET BY MOUTH DAILY TO HELP WITH ANXIETY AND DEPRESSION   CYCLOSPORINE (RESTASIS) 0.05 % OPHTHALMIC EMULSION    1 drop every 12 (twelve) hours. One drop into left eye twice daily   DESONIDE (DESOWEN) 0.05 % CREAM    Apply topically 2 (two) times daily. Reported on 02/02/2016   ESTRADIOL (ESTRACE) 0.1 MG/GM VAGINAL CREAM    Apply 1 gm to vaginal area 3 times a week as directed.   HYDROCHLOROTHIAZIDE (HYDRODIURIL) 25 MG TABLET    TAKE 1 TABLET (25 MG TOTAL) BY MOUTH DAILY. FOR BLOOD PRESSURE   LOSARTAN (COZAAR) 100 MG TABLET    Take 100 mg by mouth daily.   MAGNESIUM 400 MG CAPS    Take by mouth. Take one tablet twice daily for leg cramps   METOPROLOL (LOPRESSOR) 50 MG TABLET    One daily in  morning to control blood pressure   METRONIDAZOLE (METROGEL) 0.75 % GEL    Apply topically twice daly to help rash   MULTIPLE VITAMINS-MINERALS ([employer] SPECTRAVITE PO)    Take by mouth. One daily for eyes   NAPROXEN SODIUM (ALEVE) 220 MG TABLET    Take 220 mg by mouth. Take two tablets daily as needed for pain   RALOXIFENE (EVISTA) 60 MG TABLET    Take 1 tablet (60 mg total) by mouth daily. To treat osteoporosis   SODIUM CHLORIDE (MURO 128) 5 % OPHTHALMIC SOLUTION    Place 1 drop into the right eye 2 (two) times daily.    VITAMIN C (ASCORBIC ACID) 500 MG TABLET    Take 500 mg by mouth daily.  Modified Medications   No  medications on file  Discontinued Medications   No medications on file     Review of Systems  Constitutional: Positive for fatigue. Negative for activity change, appetite change, chills, diaphoresis, fever and unexpected weight change.  HENT: Positive for hearing loss. Negative for congestion, ear pain, mouth sores, rhinorrhea, sinus pressure and sore throat.   Eyes: Positive for visual disturbance (Rx lenses).       Ocular migraine right eye.  Respiratory: Negative for cough, chest tightness, wheezing and stridor.   Cardiovascular: Negative for chest pain, palpitations and leg swelling.  Gastrointestinal: Negative for abdominal distention, abdominal pain, constipation, diarrhea and nausea.       Large Cyst in liver. Patient has seen Dr. Laurence Spates and Dr. Stark Klein. They recommend that we just watch this for the time being.  Endocrine: Negative.  Negative for cold intolerance, heat intolerance and polyuria.  Genitourinary:       Chronic leakage. Has had urologic evaluation. Renal cyst  Musculoskeletal: Positive for back pain, gait problem (unsteady balance) and neck pain. Negative for arthralgias, joint swelling, myalgias and neck stiffness.  Skin:       Chronic venous insufficiency with multiple superficial varicosities. Chronic edema that worsens during the day. Recurrent issues of erythema of the feet, occasional blistering, and pruritus towards the end of the day.  Allergic/Immunologic: Negative.   Neurological: Positive for weakness. Negative for dizziness, tremors, seizures, syncope, facial asymmetry, speech difficulty, light-headedness, numbness and headaches.       Some balance issues. Hx of falls.  Has noted numbness and some tingling in the feet and lower legs. Fingers and hands seem to be spared.  CT scan of the brain in March 2016 showed small vessel disease, but was otherwise normal.  Hematological: Negative.   Psychiatric/Behavioral: Positive for dysphoric mood. The  patient is nervous/anxious.        Constant worries about what the future holds for her.    Vitals:   08/26/16 1116  BP: (!) 146/84  Pulse: (!) 57  Temp: 97.4 F (36.3 C)  TempSrc: Oral  SpO2: 91%  Weight: 137 lb (62.1 kg)  Height: 5' 4"  (1.626 m)   Wt Readings from Last 3 Encounters:  08/26/16 137 lb (62.1 kg)  06/03/16 138 lb (62.6 kg)  05/27/16 137 lb 9.6 oz (62.4 kg)    Body mass index is 23.52 kg/m.  Physical Exam  Constitutional: She is oriented to person, place, and time. She appears well-developed and well-nourished. No distress.  HENT:  Head: Normocephalic and atraumatic.  Right Ear: External ear normal.  Left Ear: External ear normal.  Nose: Nose normal.  Mouth/Throat: Oropharynx is clear and moist.  Eyes: Conjunctivae and EOM are normal.  Pupils are equal, round, and reactive to light.  Corrective lenses.  Neck: Neck supple. No JVD present. No tracheal deviation present. No thyromegaly present.  Cardiovascular: Normal rate, regular rhythm, normal heart sounds and intact distal pulses.  Exam reveals no gallop and no friction rub.   No murmur heard. Pulmonary/Chest: No respiratory distress. She has no wheezes. She has no rales. She exhibits no tenderness.  Abdominal: She exhibits no distension and no mass. There is tenderness. There is guarding.  History of large cyst of the liver. She has seen both a gastroenterologist, Edwards, and a general surgeon, Dr. Stark Klein.  Musculoskeletal: Normal range of motion. She exhibits no edema or tenderness.  Hx of Rupture of the head of the right biceps laterally  Lymphadenopathy:    She has no cervical adenopathy.  Neurological: She is alert and oriented to person, place, and time. She has normal reflexes. No cranial nerve deficit. Coordination normal.  02/05/16 MMSE 30/30. Passed clock drawing. 08/26/16 MMSE 28/30. Passed clock drawing.  Skin: No rash noted. No erythema. No pallor.  There are superficial varicosities and  mild swelling of both feet. Reddish discoloration of the right distal forefoot close to the interdigital spaces.  Psychiatric: Her behavior is normal. Judgment and thought content normal.  chronic anxiety and mild depression     Labs reviewed: Lab Summary Latest Ref Rng & Units 06/02/2016 03/13/2016 02/02/2016 11/29/2015  Hemoglobin 12.2 - 16.2 g/dL (None) (None) 12.0(A) 12.5  Hematocrit 37.7 - 47.9 % (None) (None) 34.6(A) 38  White count 4.6 - 10.2 K/uL (None) (None) 10.7(A) 5.6  Platelet count 150 - 399 K/L (None) (None) (None) 193  Sodium 137 - 147 mmol/L 135(A) 137 128(L) 135(A)  Potassium 3.4 - 5.3 mmol/L 4.3 4.5 3.5 3.8  Calcium 8.6 - 10.4 mg/dL (None) (None) 9.1 (None)  Phosphorus - (None) (None) (None) (None)  Creatinine 0.5 - 1.1 mg/dL 0.7 0.7 0.54(L) 0.6  AST 13 - 35 U/L 26 (None) 28 34  Alk Phos 25 - 125 U/L 119 (None) 86 91  Bilirubin 0.2 - 1.2 mg/dL (None) (None) 0.8 (None)  Glucose mg/dL 89 86 99 96  Cholesterol - (None) (None) (None) (None)  HDL cholesterol - (None) (None) (None) (None)  Triglycerides - (None) (None) (None) (None)  LDL Direct - (None) (None) (None) (None)  LDL Calc - (None) (None) (None) (None)  Total protein 6.1 - 8.1 g/dL (None) (None) 6.7 (None)  Albumin 3.6 - 5.1 g/dL (None) (None) 3.5(L) (None)  Some recent data might be hidden   Lab Results  Component Value Date   TSH 4.00 11/29/2015   Lab Results  Component Value Date   BUN 15 06/02/2016   BUN 15 03/13/2016   BUN 11 02/02/2016   Lab Results  Component Value Date   CREATININE 0.7 06/02/2016   CREATININE 0.7 03/13/2016   CREATININE 0.54 (L) 02/02/2016   No results found for: HGBA1C     Assessment/Plan  1. Memory disturbance  donepezil to her drugs. She preferred not to do this at present time.  2. Essential hypertension  Controlled  3. Hypothyroidism, unspecified hypothyroidism type  compensated  4. Unstable gait use walker virtually al the time  5. Adjustment  disorder with mixed anxiety and depressed mood  stable to improved  6. Liver cyst  asymptomatic  7. Urinary incontinence, unspecified incontinence type  discussed having a metallic bedside, restriction of fluids after 7 PM, and possible addition of medication. When I  Discussed potential side effects  of constipation and dryness of the mouth, she preferred not to start any additional Medications.  8. Slow transit constipation  Continue Prunes. Substitute Senokot-S 1-2 Tablets at Bedtime for the Colace. Other Alternatives Include MiraLAX.Marland Kitchen  9. Rash and nonspecific skin eruption observe

## 2016-09-06 ENCOUNTER — Other Ambulatory Visit: Payer: Self-pay | Admitting: Internal Medicine

## 2016-09-08 ENCOUNTER — Other Ambulatory Visit: Payer: Self-pay | Admitting: Internal Medicine

## 2016-10-04 ENCOUNTER — Ambulatory Visit (INDEPENDENT_AMBULATORY_CARE_PROVIDER_SITE_OTHER): Payer: Medicare Other

## 2016-10-04 ENCOUNTER — Ambulatory Visit (INDEPENDENT_AMBULATORY_CARE_PROVIDER_SITE_OTHER): Payer: Medicare Other | Admitting: Urgent Care

## 2016-10-04 VITALS — BP 120/84 | HR 70 | Temp 98.1°F | Resp 16 | Ht 64.0 in | Wt 137.0 lb

## 2016-10-04 DIAGNOSIS — S3992XA Unspecified injury of lower back, initial encounter: Secondary | ICD-10-CM | POA: Diagnosis not present

## 2016-10-04 DIAGNOSIS — M545 Low back pain, unspecified: Secondary | ICD-10-CM

## 2016-10-04 DIAGNOSIS — W182XXA Fall in (into) shower or empty bathtub, initial encounter: Secondary | ICD-10-CM

## 2016-10-04 DIAGNOSIS — Z8781 Personal history of (healed) traumatic fracture: Secondary | ICD-10-CM

## 2016-10-04 DIAGNOSIS — K5900 Constipation, unspecified: Secondary | ICD-10-CM | POA: Diagnosis not present

## 2016-10-04 MED ORDER — SENNA-DOCUSATE SODIUM 8.6-50 MG PO TABS: 1.0000 | ORAL_TABLET | Freq: Every day | ORAL | 5 refills | Status: DC

## 2016-10-04 MED ORDER — NON FORMULARY: 0 refills | Status: DC

## 2016-10-04 MED ORDER — POLYETHYLENE GLYCOL 3350 17 GM/SCOOP PO POWD: 17.0000 g | Freq: Every day | ORAL | 1 refills | Status: AC | PRN

## 2016-10-04 MED ORDER — ACETAMINOPHEN-CODEINE #3 300-30 MG PO TABS: 1.0000 | ORAL_TABLET | Freq: Four times a day (QID) | ORAL | 0 refills | Status: DC | PRN

## 2016-10-04 NOTE — Patient Instructions (Addendum)
Spinal Compression Fracture °A spinal compression fracture is a collapse of the bones that form the spine (vertebrae). With this type of fracture, the vertebrae become squashed (compressed) into a wedge shape. Most compression fractures happen in the middle or lower part of the spine. °CAUSES °This condition may be caused by: °· Thinning and loss of density in the bones (osteoporosis). This is the most common cause. °· A fall. °· A car or motorcycle accident. °· Cancer. °· Trauma, such as a heavy, direct hit to the head. °RISK FACTORS °You may be at greater risk for a spinal compression fracture if you: °· Are 50 years old or older. °· Have osteoporosis. °· Have certain types of cancer, including: °¨ Multiple myeloma. °¨ Lymphoma. °¨ Prostate cancer. °¨ Lung cancer. °¨ Breast cancer. °SYMPTOMS °Symptoms of this condition include: °· Severe pain. °· Pain that gets worse over time. °· Pain that is worse when you stand, walk, sit, or bend. °· Sudden pain that is so bad that it is hard for you to move. °· Bending or humping of the spine. °· Gradual loss of height. °· Numbness, tingling, or weakness in the back and legs. °· Trouble walking. °Your symptoms will depend on the cause of the fracture and how quickly it develops. For example, fractures that are caused by osteoporosis can cause few symptoms, no symptoms, or symptoms that develop slowly over time. °DIAGNOSIS °This condition may be diagnosed based on symptoms, medical history, and a physical exam. During the physical exam, your health care provider may tap along the length of your spine to check for tenderness. Tests may be done to confirm the diagnosis. They may include: °· A bone density test to check for osteoporosis. °· Imaging tests, such as a spine X-ray, a CT scan, or MRI. °TREATMENT °Treatment for this condition depends on the cause and severity of the condition. Some fractures, such as those that are caused by osteoporosis, may heal on their own with  supportive care. This may include: °· Pain medicine. °· Rest. °· A back brace. °· Physical therapy exercises. °· Medicine that reduces bone pain. °· Calcium and vitamin D supplements. °Fractures that cause the back to become misshapen, cause nerve pain or weakness, or do not respond to other treatment may be treated with a surgical procedure, such as: °· Vertebroplasty. In this procedure, bone cement is injected into the collapsed vertebrae to stabilize them. °· Balloon kyphoplasty. In this procedure, the collapsed vertebrae are expanded with a balloon and then bone cement is injected into them. °· Spinal fusion. In this procedure, the collapsed vertebrae are connected (fused) to normal vertebrae. °HOME CARE INSTRUCTIONS °General Instructions °· Take medicines only as directed by your health care provider. °· Do not drive or operate heavy machinery while taking pain medicine. °· If directed, apply ice to the injured area: °¨ Put ice in a plastic bag. °¨ Place a towel between your skin and the bag. °¨ Leave the ice on for 30 minutes every two hours at first. Then apply the ice as needed. °· Wear your neck brace or back brace as directed by your health care provider. °· Do not drink alcohol. Alcohol can interfere with your treatment. °· Keep all follow-up visits as directed by your health care provider. This is important. It can help to prevent permanent injury, disability, and long-lasting (chronic) pain. °Activity °· Stay in bed (on bed rest) only as directed by your health care provider. Being on bed rest for too long can   make your condition worse.  Return to your normal activities as directed by your health care provider. Ask what activities are safe for you.  Do exercises to improve motion and strength in your back (physical therapy), as recommended by your health care provider.  Exercise regularly as directed by your health care provider. SEEK MEDICAL CARE IF:  You have a fever.  You develop a cough  that makes your pain worse.  Your pain medicine is not helping.  Your pain does not get better over time.  You cannot return to your normal activities as planned or expected. SEEK IMMEDIATE MEDICAL CARE IF:  Your pain is very bad and it suddenly gets worse.  You are unable to move any body part (paralysis) that is below the level of your injury.  You have numbness, tingling, or weakness in any body part that is below the level of your injury.  You cannot control your bladder or bowels.   This information is not intended to replace advice given to you by your health care provider. Make sure you discuss any questions you have with your health care provider.   Document Released: 12/08/2005 Document Revised: 04/24/2015 Document Reviewed: 12/12/2014 Elsevier Interactive Patient Education 2016 [employer].     IF you received an x-ray today, you will receive an invoice from [facility]. Please contact [facility] at [phone] with questions or concerns regarding your invoice.   IF you received labwork today, you will receive an invoice from [employer]. Please contact [facility] at [phone] with questions or concerns regarding your invoice.   Our billing staff will not be able to assist you with questions regarding bills from these companies.  You will be contacted with the lab results as soon as they are available. The fastest way to get your results is to activate your My Chart account. Instructions are located on the last page of this paperwork. If you have not heard from [place] regarding the results in 2 weeks, please contact this office.

## 2016-10-04 NOTE — Progress Notes (Signed)
By signing my name below, I, Gina Bolton, attest that this documentation has been prepared under the direction and in the presence of Gina Adie, PA-C.  Electronically Signed: Thea Bolton, ED Scribe. 10/04/2016. 2:24 PM.   MRN: [id number] DOB: Aug 06, 1920  Subjective:   Gina Bolton is a 82 y.o. female presenting for chief complaint of Back Pain (Pt fell on wednesday - pain in low back )  Pt presents today with her daughter who is also her alternative health care proxy.   Pt had a fall 3 days ago. States she slipped and fell into the shower, landing on her buttock. No head injury or LOC. She was able to ambulate on her own afterwards. Since, she has developed constant, aching low back pain that is worse in the morning and with walking. Initially after fall pt took 2 Alleve but her daughter has been administering pt 1-2 Alleve in the morning and then 2 extra strength tylenol later in the evening. Pt uses a walker. Pt also notes constipation since fall, having had 1 stool since. Per daughter, pt has had used Miralax in the past. Pt denies numbness, tingling, weakness and radiating pain to lower extremities, incontinence, confusion.  Gina Bolton has a current medication list which includes the following prescription(s): calcium + d3, cholecalciferol, citalopram, cyclosporine, desonide, estradiol, losartan, magnesium, metoprolol, metronidazole, multiple vitamins-minerals, naproxen sodium, raloxifene, sodium chloride, vitamin c, acetaminophen-codeine, hydrochlorothiazide, NON FORMULARY, polyethylene glycol powder, and sennosides-docusate sodium.  Also is allergic to ibuprofen; macrobid [nitrofurantoin monohyd macro]; and sulfa antibiotics.  Gina Bolton  has a past medical history of Abnormality of gait (11/11/2005); Acquired cyst of kidney (9///2008); Allergic rhinitis, cause unspecified (01/14/2000); Carpal tunnel syndrome (01/11/2016); Cervicalgia (11/20/2009); Chronic rhinitis (12/02/2011); Closed  fracture of second cervical vertebra without mention of spinal cord injury (09/10/2010); Closed fracture of unspecified part of ramus of mandible (06/11/2010); Cloudy urine; Constipation (08/26/2016); Cramp of limb (01/14/2000); Disorder of bone and cartilage, unspecified (05/04/1995); Disturbance of skin sensation (11/11/2005); Dysphonia (01/25/2013); Edema (10/09/2010); Hemangioma of other sites (9//2008); Hereditary spherocytosis ([place]) (01/14/2000); History of fall (11/06/2015); Malignant neoplasm of breast (female), unspecified site [facility]) (05/24/1997); Mild cognitive impairment, so stated (12/02/2011); Neurogenic bladder, NOS (02/23/2007); Obstructive sleep apnea (adult) (pediatric) (04/21/2006); Osteoarthrosis, unspecified whether generalized or localized, unspecified site (03/09/2001); Other and unspecified hyperlipidemia (03/09/2001); Other sequelae of chronic liver disease (08/31/2007); Paresthesia (11/06/2015); Peripheral neuropathy ([place]) (10/10/2013); Personal history of malignant neoplasm of breast (01/14/2000); Rosacea (10/24/2008); Senile osteoporosis (03/09/2001); Shortness of breath (11/20/2009); Spinal stenosis in cervical region (09/10/2010); Spinal stenosis, lumbar region, without neurogenic claudication (08/13/2009); Unspecified constipation (02/23/2007); Unspecified essential hypertension (08/02/2002); Unspecified hereditary and idiopathic peripheral neuropathy (05/08/2009); Unspecified hypothyroidism (04/19/2001); Urgency of urination (08/26/2011); Urinary frequency (07/30/2010); and Urine incontinence (08/26/2016). Also  has a past surgical history that includes Tonsillectomy; Abdominal hysterectomy (1974); Breast lumpectomy (Left, 1998); Bladder repair; Cataract extraction w/ intraocular lens implant (07/1999); Corneal transplant (Left, 2002); and Colonoscopy (02/28/1999).  Objective:   Vitals: BP 120/84    Pulse 70    Temp 98.1 F (36.7 C) (Oral)    Resp 16    Ht 5\' 4"  (1.626 m)    Wt 137 lb (62.1 kg)    SpO2 96%    BMI  23.52 kg/m   Physical Exam  Constitutional: She is oriented to person, place, and time. She appears well-developed and well-nourished.  Cardiovascular: Normal rate, regular rhythm and intact distal pulses.  Exam reveals no gallop and no friction rub.   No murmur heard. Pulmonary/Chest: No  respiratory distress. She has no wheezes. She has no rales.  Musculoskeletal:       Cervical back: She exhibits normal range of motion, no tenderness, no bony tenderness, no swelling, no edema, no deformity and no spasm.       Thoracic back: She exhibits normal range of motion, no tenderness, no bony tenderness, no swelling, no edema, no deformity and no spasm.       Lumbar back: She exhibits decreased range of motion (flexion, extension) and tenderness (over area depicted). She exhibits no bony tenderness, no swelling, no edema, no deformity and no spasm.       Back:  Neurological: She is alert and oriented to person, place, and time.  Patellar tendon reflex is 0 bilaterally (per patient has always been this way).  Skin: Skin is warm and dry.    Dg Lumbar Spine Complete  Result Date: 10/04/2016 CLINICAL DATA:  Fall 3 days ago, constant low back pain, no radiation, initial encounter. EXAM: LUMBAR SPINE - COMPLETE 4+ VIEW COMPARISON:  None. FINDINGS: Levoconvex scoliosis of the lumbar spine. Alignment is otherwise anatomic. The appearance of the L2 and L3 superior endplates is that of mild compression on the lateral view but this may be accounted for possibly by scoliosis. Multilevel degenerative disc disease, worst at L1-2 and L4-5 where there is also loss of disc space height. Facet hypertrophy throughout the lumbar spine, worst at L5-S1. IMPRESSION: 1. Appearance of the superior endplates of L2 and L3 is likely projectional, related to scoliosis. Difficult to definitively exclude compression fractures. 2. Multilevel degenerative disc disease, worst at L1-2 and L4-5. Electronically Signed   By: Lorin Picket M.D.   On: 10/04/2016 15:11   Dg Sacrum/coccyx  Result Date: 10/04/2016 CLINICAL DATA:  Fall, constant low back pain without radiation, initial encounter. EXAM: SACRUM AND COCCYX - 2+ VIEW COMPARISON:  None. FINDINGS: No acute osseous abnormality. Degenerative changes are seen in the lower lumbar spine. Old left superior and inferior pubic rami fractures. IMPRESSION: 1. No acute findings. 2. Degenerative disc disease. Electronically Signed   By: Lorin Picket M.D.   On: 10/04/2016 15:13   Assessment and Plan :   This case was precepted with Dr. Delman Cheadle.   1. Fall in (into) shower or empty bathtub, initial encounter 2. Acute right-sided low back pain without sciatica 3. History of pelvic fracture - Physical exam findings reassuring, start APAP #3 with 325mg  of Tylenol every 6 hours. Counseled on worsening symptoms warranting urgent referral to ortho or recheck. Patient declined referral for now. I did provide patient with a script for home health aid for the morning.   4. Constipation, unspecified constipation type - Recommended aggressive constipation management.  Jaynee Eagles, PA-C [facility] [phone] 10/04/2016 2:24 PM

## 2016-10-07 ENCOUNTER — Encounter: Payer: Self-pay | Admitting: Internal Medicine

## 2016-10-07 ENCOUNTER — Non-Acute Institutional Stay: Payer: Medicare Other | Admitting: Internal Medicine

## 2016-10-07 VITALS — BP 117/68 | HR 57 | Temp 98.2°F | Ht 64.0 in | Wt 137.0 lb

## 2016-10-07 DIAGNOSIS — M48061 Spinal stenosis, lumbar region without neurogenic claudication: Secondary | ICD-10-CM | POA: Diagnosis not present

## 2016-10-07 DIAGNOSIS — M545 Low back pain, unspecified: Secondary | ICD-10-CM | POA: Insufficient documentation

## 2016-10-07 DIAGNOSIS — R2681 Unsteadiness on feet: Secondary | ICD-10-CM | POA: Diagnosis not present

## 2016-10-07 DIAGNOSIS — R32 Unspecified urinary incontinence: Secondary | ICD-10-CM | POA: Diagnosis not present

## 2016-10-07 DIAGNOSIS — R413 Other amnesia: Secondary | ICD-10-CM

## 2016-10-07 NOTE — Progress Notes (Signed)
Facility      Place of Service: Clinic (12)     Allergies  Allergen Reactions  . Ibuprofen Nausea Only  . Macrobid [Nitrofurantoin Monohyd Macro]     Swelling and flush   . Sulfa Antibiotics     Chief Complaint  Patient presents with  . Acute Visit    hurting in lower back, hip area. 10/01/16 fell backwards in shower landed on her bottom. Went to [facility] 10/04/16, x-ray back, gave her Tylenol #3 one every 6 hours PRN moderate pain and Senna one daily. Here with daughter Adonis Huguenin .    HPI:  Xray on 10/04/16 showed multilevel DDD, worst at L1-2 and L4-5. May have compression fx at L2 and L3. She is walking fairly comfortably by using a 4 wheel walker with seat and brakes. She is very stiff when getting out of bed in the morning, but slowly improves over the next hour. She denies any new weakness in the legs, but her daughter says she has seen her dragging the right leg. Patient declares that if anything is wong, it is a weaker feeling in the left leg. She has known spinal stenosis in the cervical and lumbar areas.   There has been some increase in constipation. She has a chronic issue with urinary incontinence and urgency. This does not seem changed to her.  She has noted some memory changes in the past. She does not think it is worse. Heer daughter noted she was confused 2 nights ago and worries about how her mother will reeliably take her medications.  In the [place] today, her daughter proposed that her mother have home care assistance 2-3 days per weak to assist in bathing and medication supervision. Patient clearly stated that she does not feel she needs this. She will discuss further with her daughter. I will refer if patient agrees.   Medications: Patient's Medications  New Prescriptions   No medications on file  Previous Medications   ACETAMINOPHEN-CODEINE (TYLENOL #3) 300-30 MG TABLET    Take 1 tablet by mouth every 6 (six) hours as needed for moderate pain.   CALCIUM  CARB-CHOLECALCIFEROL (CALCIUM + D3) 600-200 MG-UNIT TABS    Take by mouth. Take one tablet twice daily   CHOLECALCIFEROL (VITAMIN D-400 PO)    Take by mouth. Take one daily for vitamin D supplement   CITALOPRAM (CELEXA) 10 MG TABLET    TAKE 1 TABLET BY MOUTH DAILY TO HELP WITH ANXIETY AND DEPRESSION   CYCLOSPORINE (RESTASIS) 0.05 % OPHTHALMIC EMULSION    1 drop every 12 (twelve) hours. One drop into left eye twice daily   DESONIDE (DESOWEN) 0.05 % CREAM    Apply topically 2 (two) times daily. Reported on 02/02/2016   ESTRADIOL (ESTRACE) 0.1 MG/GM VAGINAL CREAM    Apply 1 gm to vaginal area 3 times a week as directed.   HYDROCHLOROTHIAZIDE (HYDRODIURIL) 25 MG TABLET    TAKE 1 TABLET (25 MG TOTAL) BY MOUTH DAILY. FOR BLOOD PRESSURE   LOSARTAN (COZAAR) 100 MG TABLET    TAKE ONE TABLET BY MOUTH  DAILY TO LOWER BLOOD PRESSURE  (NEW DOSE)   MAGNESIUM 400 MG CAPS    Take by mouth. Take one tablet twice daily for leg cramps   METOPROLOL (LOPRESSOR) 50 MG TABLET    One daily in morning to control blood pressure   METRONIDAZOLE (METROGEL) 0.75 % GEL    Apply topically twice daly to help rash   MULTIPLE VITAMINS-MINERALS ([employer] SPECTRAVITE PO)  Take by mouth. One daily for eyes   NAPROXEN SODIUM (ALEVE) 220 MG TABLET    Take 220 mg by mouth. Take two tablets daily as needed for pain   NON FORMULARY    Please provide a home health aide for a duration of 2 weeks to help the patient get out of bed, ambulate in the morning.   POLYETHYLENE GLYCOL POWDER (GLYCOLAX/MIRALAX) POWDER    Take 17 g by mouth daily as needed.   RALOXIFENE (EVISTA) 60 MG TABLET    Take 1 tablet (60 mg total) by mouth daily. To treat osteoporosis   SENNOSIDES-DOCUSATE SODIUM (SENOKOT-S) 8.6-50 MG TABLET    Take 1 tablet by mouth daily.   SODIUM CHLORIDE (MURO 128) 5 % OPHTHALMIC SOLUTION    Place 1 drop into the right eye 2 (two) times daily.    VITAMIN C (ASCORBIC ACID) 500 MG TABLET    Take 500 mg by mouth daily.  Modified Medications    No medications on file  Discontinued Medications   No medications on file     Review of Systems  Constitutional: Positive for fatigue. Negative for activity change, appetite change, chills, diaphoresis, fever and unexpected weight change.  HENT: Positive for hearing loss. Negative for congestion, ear pain, mouth sores, rhinorrhea, sinus pressure and sore throat.   Eyes: Positive for visual disturbance (Rx lenses).       Ocular migraine right eye.  Respiratory: Negative for cough, chest tightness, wheezing and stridor.   Cardiovascular: Negative for chest pain, palpitations and leg swelling.  Gastrointestinal: Negative for abdominal distention, abdominal pain, constipation, diarrhea and nausea.       Large Cyst in liver. Patient has seen Dr. Laurence Spates and Dr. Stark Klein. They recommend that we just watch this for the time being.  Endocrine: Negative.  Negative for cold intolerance, heat intolerance and polyuria.  Genitourinary:       Chronic leakage. Has had urologic evaluation. Renal cyst  Musculoskeletal: Positive for back pain, gait problem (unsteady balance) and neck pain. Negative for arthralgias, joint swelling, myalgias and neck stiffness.       Possible fracture of the vertebral body at L1 and L2.  Skin:       Chronic venous insufficiency with multiple superficial varicosities. Chronic edema that worsens during the day. Recurrent issues of erythema of the feet, occasional blistering, and pruritus towards the end of the day.  Allergic/Immunologic: Negative.   Neurological: Positive for weakness. Negative for dizziness, tremors, seizures, syncope, facial asymmetry, speech difficulty, light-headedness, numbness and headaches.       Some balance issues. Hx of falls.  Has noted numbness and some tingling in the feet and lower legs. Fingers and hands seem to be spared.  CT scan of the brain in March 2016 showed small vessel disease, but was otherwise normal.  Hematological:  Negative.   Psychiatric/Behavioral: Positive for dysphoric mood. The patient is nervous/anxious.        Constant worries about what the future holds for her.    Vitals:   10/07/16 0857  BP: 117/68  Pulse: (!) 57  Temp: 98.2 F (36.8 C)  TempSrc: Oral  SpO2: 94%  Weight: 137 lb (62.1 kg)  Height: 5' 4"  (1.626 m)   Wt Readings from Last 3 Encounters:  10/07/16 137 lb (62.1 kg)  10/04/16 137 lb (62.1 kg)  08/26/16 137 lb (62.1 kg)    Body mass index is 23.52 kg/m.  Physical Exam  Constitutional: She is oriented to person,  place, and time. She appears well-developed and well-nourished. No distress.  HENT:  Head: Normocephalic and atraumatic.  Right Ear: External ear normal.  Left Ear: External ear normal.  Nose: Nose normal.  Mouth/Throat: Oropharynx is clear and moist.  Eyes: Conjunctivae and EOM are normal. Pupils are equal, round, and reactive to light.  Corrective lenses.  Neck: Neck supple. No JVD present. No tracheal deviation present. No thyromegaly present.  Cardiovascular: Normal rate, regular rhythm, normal heart sounds and intact distal pulses.  Exam reveals no gallop and no friction rub.   No murmur heard. Pulmonary/Chest: No respiratory distress. She has no wheezes. She has no rales. She exhibits no tenderness.  Abdominal: She exhibits no distension and no mass. There is tenderness. There is guarding.  History of large cyst of the liver. She has seen both a gastroenterologist, Edwards, and a general surgeon, Dr. Stark Klein.  Musculoskeletal: Normal range of motion. She exhibits no edema or tenderness.  Hx of Rupture of the head of the right biceps laterally. Lumbar discomfort in the mid and right lumbar areas. Unstable gait. Using 4 wheel walker with seat and brakes.  Lymphadenopathy:    She has no cervical adenopathy.  Neurological: She is alert and oriented to person, place, and time. She has normal reflexes. No cranial nerve deficit. Coordination normal.    02/05/16 MMSE 30/30. Passed clock drawing. 08/26/16 MMSE 28/30. Passed clock drawing.  Skin: No rash noted. No erythema. No pallor.  There are superficial varicosities and mild swelling of both feet. Reddish discoloration of the right distal forefoot close to the interdigital spaces.  Psychiatric: Her behavior is normal. Judgment and thought content normal.  chronic anxiety and mild depression     Labs reviewed: Lab Summary Latest Ref Rng & Units 06/02/2016 03/13/2016 02/02/2016 11/29/2015  Hemoglobin 12.2 - 16.2 g/dL (None) (None) 12.0(A) 12.5  Hematocrit 37.7 - 47.9 % (None) (None) 34.6(A) 38  White count 4.6 - 10.2 K/uL (None) (None) 10.7(A) 5.6  Platelet count 150 - 399 K/L (None) (None) (None) 193  Sodium 137 - 147 mmol/L 135(A) 137 128(L) 135(A)  Potassium 3.4 - 5.3 mmol/L 4.3 4.5 3.5 3.8  Calcium 8.6 - 10.4 mg/dL (None) (None) 9.1 (None)  Phosphorus - (None) (None) (None) (None)  Creatinine 0.5 - 1.1 mg/dL 0.7 0.7 0.54(L) 0.6  AST 13 - 35 U/L 26 (None) 28 34  Alk Phos 25 - 125 U/L 119 (None) 86 91  Bilirubin 0.2 - 1.2 mg/dL (None) (None) 0.8 (None)  Glucose mg/dL 89 86 99 96  Cholesterol - (None) (None) (None) (None)  HDL cholesterol - (None) (None) (None) (None)  Triglycerides - (None) (None) (None) (None)  LDL Direct - (None) (None) (None) (None)  LDL Calc - (None) (None) (None) (None)  Total protein 6.1 - 8.1 g/dL (None) (None) 6.7 (None)  Albumin 3.6 - 5.1 g/dL (None) (None) 3.5(L) (None)  Some recent data might be hidden   Lab Results  Component Value Date   TSH 4.00 11/29/2015   Lab Results  Component Value Date   BUN 15 06/02/2016   BUN 15 03/13/2016   BUN 11 02/02/2016   Lab Results  Component Value Date   CREATININE 0.7 06/02/2016   CREATININE 0.7 03/13/2016   CREATININE 0.54 (L) 02/02/2016   No results found for: HGBA1C  Dg Lumbar Spine Complete  Result Date: 05/27/2016 CLINICAL DATA:  Left buttock pain radiating down the left leg. History of breast  cancer. EXAM: LUMBAR SPINE - COMPLETE 4+ VIEW  COMPARISON:  CT of the abdomen pelvis 02/02/2016 FINDINGS: There is no evidence of lumbar spine fracture. There is mild S shaped lumbosacral spine scoliosis with advanced multilevel osteoarthritic changes of the lumbosacral spine including disc space narrowing, degenerative vertebral body remodeling, endplate sclerosis and advanced posterior facet arthropathy. These changes are more severe in the lower lumbosacral spine. Incidental note is made of T12 hemangioma. IMPRESSION: Multilevel osteoarthritic changes of the lumbosacral spine with S-shaped scoliosis. Electronically Signed   By: Fidela Salisbury M.D.   On: 05/27/2016 19:23   Dg Lumbar Spine Complete  Result Date: 10/04/2016 CLINICAL DATA:  Fall 3 days ago, constant low back pain, no radiation, initial encounter. EXAM: LUMBAR SPINE - COMPLETE 4+ VIEW COMPARISON:  None. FINDINGS: Levoconvex scoliosis of the lumbar spine. Alignment is otherwise anatomic. The appearance of the L2 and L3 superior endplates is that of mild compression on the lateral view but this may be accounted for possibly by scoliosis. Multilevel degenerative disc disease, worst at L1-2 and L4-5 where there is also loss of disc space height. Facet hypertrophy throughout the lumbar spine, worst at L5-S1. IMPRESSION: 1. Appearance of the superior endplates of L2 and L3 is likely projectional, related to scoliosis. Difficult to definitively exclude compression fractures. 2. Multilevel degenerative disc disease, worst at L1-2 and L4-5. Electronically Signed   By: Lorin Picket M.D.   On: 10/04/2016 15:11   Dg Sacrum/coccyx  Result Date: 10/04/2016 CLINICAL DATA:  Fall, constant low back pain without radiation, initial encounter. EXAM: SACRUM AND COCCYX - 2+ VIEW COMPARISON:  None. FINDINGS: No acute osseous abnormality. Degenerative changes are seen in the lower lumbar spine. Old left superior and inferior pubic rami fractures. IMPRESSION:  1. No acute findings. 2. Degenerative disc disease. Electronically Signed   By: Lorin Picket M.D.   On: 10/04/2016 15:13      Assessment/Plan  1. Acute right-sided low back pain without sciatica Observe,. Use Aleve twice daily. Use regular Tylenol if needed.. If pains are not controlled, use Tylenol #3 q6h prn. Return in 2 weeks for reevaluation. Discussed potential for MRI of spine and specialty referral.  2. Urinary incontinence, unspecified type unchanged  3. Memory disturbance unchanged  4. Unstable gait continue use of walker  5. Spinal stenosis, lumbar region, without neurogenic claudication chronic condition

## 2016-10-09 ENCOUNTER — Ambulatory Visit (INDEPENDENT_AMBULATORY_CARE_PROVIDER_SITE_OTHER): Payer: Medicare Other | Admitting: Sports Medicine

## 2016-10-09 DIAGNOSIS — M8448XA Pathological fracture, other site, initial encounter for fracture: Secondary | ICD-10-CM

## 2016-10-17 ENCOUNTER — Telehealth: Payer: Self-pay | Admitting: *Deleted

## 2016-10-17 NOTE — Telephone Encounter (Signed)
I approve of these requests. Can the prescriptions be sent to the listed fax number from our office?

## 2016-10-17 NOTE — Telephone Encounter (Signed)
Received a fax letter from patient's family stating: "Dear Dr. Nyoka Cowden,  As you know, our mother, Gina Bolton, fell 2 weeks ago in her apartment at [facility]. She was seen at an [facility] and you saw her on Tuesday October 17th. She has continued to be in a lot of pain due to her fall. She is taking Tramadol twice a day alternating with Aleve and this has worded to keep her pain under control. Unfortunately, we are worried that the pain and the medication have combined to exacerbate the depression that has affected her since our Dad's death in 01/29/23. You prescribed her a low dose of Celexa 10mg  last year as she was coping with [name]'s decline and caring for him on her own. Upon advice from a family member who is a Engineer, water, we would like to request that her Celexa be increased to a therapeutic dose of 20mg  daily. If you agree, please fax this prescription to [facility] at [phone]. She is going to visit our mom this Sunday, October 29th and can get the prescription filled. Our mom is not able to drive so [name] will be running errands on Sunday.  In addition, Urgent Care, had prescribed the services of a home health aide for 2 weeks to help our mom in the morning to get out of bed and ambulate. This has been very helpful for her as early morning is the time that she is in the most pain as well as unstable and in risk of falling. We wish to request a prescription for 2 additional weeks of [name]. This is only for 2 hours a day to assist her in the morning as noted above. Please fax this prescription to [name] at [phone] as she is taking care of arranging this service.  Thank you for considering our request. We are very worried about [name], our mom and appreciate all you do to help care for her.  Sincerely-on the behalf of our sisters and brothers, [name], RN-cell [phone] [name] [phone]"  Please Advise.

## 2016-10-17 NOTE — Telephone Encounter (Signed)
Called and left message on Dana's cell asking if the fax number for [name] was a secure fax number. Awaiting return call.

## 2016-10-20 NOTE — Telephone Encounter (Signed)
Gina Bolton, daughter called and stated that the Fax # given was a secure fax and it was fine to fax the information the the Fax #: [phone]. Will fax the order and Rx.

## 2016-10-21 ENCOUNTER — Non-Acute Institutional Stay: Payer: Medicare Other | Admitting: Internal Medicine

## 2016-10-21 ENCOUNTER — Encounter: Payer: Self-pay | Admitting: Internal Medicine

## 2016-10-21 ENCOUNTER — Other Ambulatory Visit: Payer: Self-pay | Admitting: *Deleted

## 2016-10-21 VITALS — BP 126/84 | HR 61 | Temp 97.6°F | Ht 64.0 in | Wt 129.0 lb

## 2016-10-21 DIAGNOSIS — F4323 Adjustment disorder with mixed anxiety and depressed mood: Secondary | ICD-10-CM

## 2016-10-21 DIAGNOSIS — R2681 Unsteadiness on feet: Secondary | ICD-10-CM | POA: Diagnosis not present

## 2016-10-21 DIAGNOSIS — M545 Low back pain, unspecified: Secondary | ICD-10-CM

## 2016-10-21 DIAGNOSIS — R609 Edema, unspecified: Secondary | ICD-10-CM

## 2016-10-21 DIAGNOSIS — I10 Essential (primary) hypertension: Secondary | ICD-10-CM | POA: Diagnosis not present

## 2016-10-21 MED ORDER — AMBULATORY NON FORMULARY MEDICATION: 0 refills | Status: DC

## 2016-10-21 MED ORDER — CITALOPRAM HYDROBROMIDE 20 MG PO TABS: ORAL_TABLET | ORAL | 6 refills | Status: DC

## 2016-10-21 MED ORDER — NON FORMULARY: 0 refills | Status: AC

## 2016-10-21 MED ORDER — CITALOPRAM HYDROBROMIDE 40 MG PO TABS: ORAL_TABLET | ORAL | 5 refills | Status: DC

## 2016-10-21 NOTE — Progress Notes (Signed)
Facility  [facility]    Place of Service: Clinic (12)     Allergies  Allergen Reactions  . Ibuprofen Nausea Only  . Macrobid [Nitrofurantoin Monohyd Macro]     Swelling and flush   . Sulfa Antibiotics     Chief Complaint  Patient presents with  . Medical Management of Chronic Issues    2 week follow-up low back pain, doing better. Saw Dr Paulla Fore 10/09/16 , Rx two new medications Tramadol 11m and Calcitonin [place] Nasal Spray.    HPI:  Acute right-sided low back pain without sciatica - impovig. Still stiff in the mornings and after sitting a while. [facility]adol and calcitonin nasal spray.  Essential hypertension - controlled  Unstable gait - using 4 wheel walker  Edema, unspecified type - improved.  Adjustment disorder with mixed anxiety and depressed mood - feeling lonely since death ogf heer husband. Recent back pains add to the misery. Her best friend is now in SNF.  Says her daughters want her to stop driving.  Lost 8# on our scales since last here. She denies nausea, diarrhea, fever, loss of appetite. Improvement in edema may have something to do with the weight loss.    Medications: Patient's Medications  New Prescriptions   No medications on file  Previous Medications   AMBULATORY NON FORMULARY MEDICATION    [place] help the patient get out of bed, ambulate in the morning   CALCITONIN, SALMON, (MIACALCIN/FORTICAL) 200 UNIT/ACT NASAL SPRAY    One spray in nostril daily, alternate nostrils for bones    - Dr. RPaulla Fore  CALCIUM CARB-CHOLECALCIFEROL (CALCIUM + D3) 600-200 MG-UNIT TABS    Take by mouth. Take one tablet twice daily   CHOLECALCIFEROL (VITAMIN D-400 PO)    Take by mouth. Take one daily for vitamin D supplement   CITALOPRAM (CELEXA) 20 MG TABLET    Take one tablet by mouth once daily to help with Anxiety and Depression   CYCLOSPORINE (RESTASIS) 0.05 % OPHTHALMIC  EMULSION    1 drop every 12 (twelve) hours. One drop into left eye twice daily   DESONIDE (DESOWEN) 0.05 % CREAM    Apply topically 2 (two) times daily. Reported on 02/02/2016   ESTRADIOL (ESTRACE) 0.1 MG/GM VAGINAL CREAM    Apply 1 gm to vaginal area 3 times a week as directed.   HYDROCHLOROTHIAZIDE (HYDRODIURIL) 25 MG TABLET    TAKE 1 TABLET (25 MG TOTAL) BY MOUTH DAILY. FOR BLOOD PRESSURE   LOSARTAN (COZAAR) 100 MG TABLET    TAKE ONE TABLET BY MOUTH  DAILY TO LOWER BLOOD PRESSURE  (NEW DOSE)   MAGNESIUM 400 MG CAPS    Take by mouth. Take one tablet twice daily for leg cramps   METOPROLOL (LOPRESSOR) 50 MG TABLET    One daily in morning to control blood pressure   METRONIDAZOLE (METROGEL) 0.75 % GEL    Apply topically twice daly to help rash   MULTIPLE VITAMINS-MINERALS ([employer] SPECTRAVITE PO)    Take by mouth. One daily for eyes   NAPROXEN SODIUM (ALEVE) 220 MG TABLET    Take 220 mg by mouth. Take two tablets daily as needed for pain   NON FORMULARY    Please provide a home health aide for a duration of 2 weeks to help the patient get out of bed, ambulate in the morning.   POLYETHYLENE GLYCOL POWDER (GLYCOLAX/MIRALAX) POWDER  Take 17 g by mouth daily as needed.   RALOXIFENE (EVISTA) 60 MG TABLET    Take 1 tablet (60 mg total) by mouth daily. To treat osteoporosis   SENNOSIDES-DOCUSATE SODIUM (SENOKOT-S) 8.6-50 MG TABLET    Take 1 tablet by mouth daily.   SODIUM CHLORIDE (MURO 128) 5 % OPHTHALMIC SOLUTION    Place 1 drop into the right eye 2 (two) times daily.    TRAMADOL (ULTRAM) 50 MG TABLET    Take by mouth. Take one tablet every 12 hours for pain   -  Dr. Paulla Fore   VITAMIN C (ASCORBIC ACID) 500 MG TABLET    Take 500 mg by mouth daily.  Modified Medications   No medications on file  Discontinued Medications   ACETAMINOPHEN-CODEINE (TYLENOL #3) 300-30 MG TABLET    Take 1 tablet by mouth every 6 (six) hours as needed for moderate pain.     Review of Systems  Constitutional: Positive for  fatigue. Negative for activity change, appetite change, chills, diaphoresis, fever and unexpected weight change.  HENT: Positive for hearing loss. Negative for congestion, ear pain, mouth sores, rhinorrhea, sinus pressure and sore throat.   Eyes: Positive for visual disturbance (Rx lenses).       Ocular migraine right eye.  Respiratory: Negative for cough, chest tightness, wheezing and stridor.   Cardiovascular: Negative for chest pain, palpitations and leg swelling.  Gastrointestinal: Negative for abdominal distention, abdominal pain, constipation, diarrhea and nausea.       Large Cyst in liver. Patient has seen Dr. Laurence Spates and Dr. Stark Klein. They recommend that we just watch this for the time being.  Endocrine: Negative.  Negative for cold intolerance, heat intolerance and polyuria.  Genitourinary:       Chronic leakage. Has had urologic evaluation. Renal cyst  Musculoskeletal: Positive for back pain, gait problem (unsteady balance) and neck pain. Negative for arthralgias, joint swelling, myalgias and neck stiffness.       Possible fracture of the vertebral body at L1 and L2.  Skin:       Chronic venous insufficiency with multiple superficial varicosities. Chronic edema that worsens during the day. Recurrent issues of erythema of the feet, occasional blistering, and pruritus towards the end of the day.  Allergic/Immunologic: Negative.   Neurological: Positive for weakness. Negative for dizziness, tremors, seizures, syncope, facial asymmetry, speech difficulty, light-headedness, numbness and headaches.       Some balance issues. Hx of falls.  Has noted numbness and some tingling in the feet and lower legs. Fingers and hands seem to be spared.  CT scan of the brain in March 2016 showed small vessel disease, but was otherwise normal.  Hematological: Negative.   Psychiatric/Behavioral: Positive for dysphoric mood. The patient is nervous/anxious.        Constant worries about what the  future holds for her.    Vitals:   10/21/16 1130  BP: 126/84  Pulse: 61  Temp: 97.6 F (36.4 C)  TempSrc: Oral  SpO2: 97%  Weight: 129 lb (58.5 kg)  Height: 5' 4"  (1.626 m)   Wt Readings from Last 3 Encounters:  10/21/16 129 lb (58.5 kg)  10/07/16 137 lb (62.1 kg)  10/04/16 137 lb (62.1 kg)    Body mass index is 22.14 kg/m.  Physical Exam  Constitutional: She is oriented to person, place, and time. She appears well-developed and well-nourished. No distress.  HENT:  Head: Normocephalic and atraumatic.  Right Ear: External ear normal.  Left Ear: External  ear normal.  Nose: Nose normal.  Mouth/Throat: Oropharynx is clear and moist.  Eyes: Conjunctivae and EOM are normal. Pupils are equal, round, and reactive to light.  Corrective lenses.  Neck: Neck supple. No JVD present. No tracheal deviation present. No thyromegaly present.  Cardiovascular: Normal rate, regular rhythm, normal heart sounds and intact distal pulses.  Exam reveals no gallop and no friction rub.   No murmur heard. Pulmonary/Chest: No respiratory distress. She has no wheezes. She has no rales. She exhibits no tenderness.  Abdominal: She exhibits no distension and no mass. There is tenderness. There is guarding.  History of large cyst of the liver. She has seen both a gastroenterologist, Edwards, and a general surgeon, Dr. Stark Klein.  Musculoskeletal: Normal range of motion. She exhibits no edema or tenderness.  Hx of Rupture of the head of the right biceps laterally. Lumbar discomfort in the mid and right lumbar areas. Unstable gait. Using 4 wheel walker with seat and brakes.  Lymphadenopathy:    She has no cervical adenopathy.  Neurological: She is alert and oriented to person, place, and time. She has normal reflexes. No cranial nerve deficit. Coordination normal.  02/05/16 MMSE 30/30. Passed clock drawing. 08/26/16 MMSE 28/30. Passed clock drawing.  Skin: No rash noted. No erythema. No pallor.  There  are superficial varicosities and mild swelling of both feet. Reddish discoloration of the right distal forefoot close to the interdigital spaces.  Psychiatric: Her behavior is normal. Judgment and thought content normal.  chronic anxiety and mild depression     Labs reviewed: Lab Summary Latest Ref Rng & Units 06/02/2016 03/13/2016 02/02/2016 11/29/2015  Hemoglobin 12.2 - 16.2 g/dL (None) (None) 12.0(A) 12.5  Hematocrit 37.7 - 47.9 % (None) (None) 34.6(A) 38  White count 4.6 - 10.2 K/uL (None) (None) 10.7(A) 5.6  Platelet count 150 - 399 K/L (None) (None) (None) 193  Sodium 137 - 147 mmol/L 135(A) 137 128(L) 135(A)  Potassium 3.4 - 5.3 mmol/L 4.3 4.5 3.5 3.8  Calcium 8.6 - 10.4 mg/dL (None) (None) 9.1 (None)  Phosphorus - (None) (None) (None) (None)  Creatinine 0.5 - 1.1 mg/dL 0.7 0.7 0.54(L) 0.6  AST 13 - 35 U/L 26 (None) 28 34  Alk Phos 25 - 125 U/L 119 (None) 86 91  Bilirubin 0.2 - 1.2 mg/dL (None) (None) 0.8 (None)  Glucose mg/dL 89 86 99 96  Cholesterol - (None) (None) (None) (None)  HDL cholesterol - (None) (None) (None) (None)  Triglycerides - (None) (None) (None) (None)  LDL Direct - (None) (None) (None) (None)  LDL Calc - (None) (None) (None) (None)  Total protein 6.1 - 8.1 g/dL (None) (None) 6.7 (None)  Albumin 3.6 - 5.1 g/dL (None) (None) 3.5(L) (None)  Some recent data might be hidden   Lab Results  Component Value Date   TSH 4.00 11/29/2015   Lab Results  Component Value Date   BUN 15 06/02/2016   BUN 15 03/13/2016   BUN 11 02/02/2016   Lab Results  Component Value Date   CREATININE 0.7 06/02/2016   CREATININE 0.7 03/13/2016   CREATININE 0.54 (L) 02/02/2016    Assessment/Plan  1. Acute right-sided low back pain without sciatica -continue current medication and exercise  2. Essential hypertension Continue current medications  3. Unstable gait Continue to use walker  4. Edema, unspecified type improved  5. Adjustment disorder with mixed anxiety  and depressed mood Increase dose of Celexa - citalopram (CELEXA) 40 MG tablet; One daily to help depression and anxiety  Dispense: 30 tablet; Refill: 5

## 2016-10-23 DIAGNOSIS — Z23 Encounter for immunization: Secondary | ICD-10-CM | POA: Diagnosis not present

## 2016-10-31 ENCOUNTER — Ambulatory Visit (INDEPENDENT_AMBULATORY_CARE_PROVIDER_SITE_OTHER): Payer: Self-pay | Admitting: Orthopaedic Surgery

## 2016-11-04 ENCOUNTER — Other Ambulatory Visit (INDEPENDENT_AMBULATORY_CARE_PROVIDER_SITE_OTHER): Payer: Self-pay | Admitting: Sports Medicine

## 2016-11-04 NOTE — Telephone Encounter (Signed)
Rx refill request

## 2016-11-05 ENCOUNTER — Telehealth: Payer: Self-pay | Admitting: *Deleted

## 2016-11-05 NOTE — Telephone Encounter (Signed)
Received a fax letter from [facility] [phone] stating: "I writing to update you about my mother, Carolyne. She is slowly healing and feeling less pain. She will be going to [place] later this week to spend about 10 days over Thanksgiving with my sister, Hinton Dyer who is a Therapist, sports.  The prescription you wrote for her for home health care runs out this week. When she returns from [place], we would like very much to continue the 2 hours of home health care in the morning starting on November 27th. This has been a godsend for her to have someone help her get up, ambulate, make sure she takes her medicine, etc. We (her children) feel that is has been critical in keeping her from further falls. We would appreciate it very much if you could write a prescription for her starting on November 27th to continue this care for the immediate future (however long you are able to extend it) If you are able to do this, please fax this prescription to me at [phone]. If you feel you need to see her before prescribing further care, could we set up an appointment for Tuesday, November 28th? "  Dr. Nyoka Cowden wrote a Rx to [place] 11/17/16. Rx faxed to [facility]   Bonnie sent another note over fax stating that she received the Rx but unfortunately the Rx came through a "illegal" and wants it mailed to her at:  [address] [id number]  Mailed

## 2016-11-20 ENCOUNTER — Ambulatory Visit (INDEPENDENT_AMBULATORY_CARE_PROVIDER_SITE_OTHER): Payer: Self-pay | Admitting: Sports Medicine

## 2016-11-28 ENCOUNTER — Other Ambulatory Visit: Payer: Self-pay | Admitting: Internal Medicine

## 2016-12-02 ENCOUNTER — Non-Acute Institutional Stay: Payer: Medicare Other | Admitting: Internal Medicine

## 2016-12-02 ENCOUNTER — Encounter: Payer: Self-pay | Admitting: Internal Medicine

## 2016-12-02 VITALS — BP 120/84 | HR 75 | Temp 97.6°F | Ht 64.0 in | Wt 128.0 lb

## 2016-12-02 DIAGNOSIS — I10 Essential (primary) hypertension: Secondary | ICD-10-CM | POA: Diagnosis not present

## 2016-12-02 DIAGNOSIS — R413 Other amnesia: Secondary | ICD-10-CM | POA: Diagnosis not present

## 2016-12-02 DIAGNOSIS — F4323 Adjustment disorder with mixed anxiety and depressed mood: Secondary | ICD-10-CM | POA: Diagnosis not present

## 2016-12-02 DIAGNOSIS — R609 Edema, unspecified: Secondary | ICD-10-CM | POA: Diagnosis not present

## 2016-12-02 NOTE — Progress Notes (Signed)
Progress Note     Facility  [facility]    Place of Service: Clinic (12)     Allergies  Allergen Reactions  . Ibuprofen Nausea Only  . Macrobid [Nitrofurantoin Monohyd Macro]     Swelling and flush   . Sulfa Antibiotics     Chief Complaint  Patient presents with  . Medical Management of Chronic Issues    6 week medication management anxiety and depression. Here with Tiffany from [place]. With her from 8 - 10 am (2 hrs a morning for 7 days a week, today 1st day)     HPI:  Last seen 10/21/16. Celexa was increased to 40 mg qd. She has been feeling lonely since the death of her husband.  Other problems evaluated were HTN, right back pain, unstable gait, and edema. They all seemed stable.  Prior back pains have improved.   She is concerned about memory lapses.  Medications: Patient's Medications  New Prescriptions   No medications on file  Previous Medications   AMBULATORY NON FORMULARY MEDICATION    Please [place] for an additional 2 weeks to help the patient get out of bed, ambulate in the morning   CALCIUM CARB-CHOLECALCIFEROL (CALCIUM + D3) 600-200 MG-UNIT TABS    Take by mouth. Take one tablet twice daily   CHOLECALCIFEROL (VITAMIN D-400 PO)    Take by mouth. Take one daily for vitamin D supplement   CITALOPRAM (CELEXA) 40 MG TABLET    One daily to help depression and anxiety   CYCLOSPORINE (RESTASIS) 0.05 % OPHTHALMIC EMULSION    1 drop every 12 (twelve) hours. One drop into left eye twice daily   DESONIDE (DESOWEN) 0.05 % CREAM    Apply topically 2 (two) times daily. Reported on 02/02/2016   ESTRADIOL (ESTRACE) 0.1 MG/GM VAGINAL CREAM    Apply 1 gm to vaginal area 3 times a week as directed.   LOSARTAN (COZAAR) 100 MG TABLET    TAKE ONE TABLET BY MOUTH  DAILY TO LOWER BLOOD PRESSURE  (NEW DOSE)   MAGNESIUM 400 MG CAPS    Take by mouth. Take one tablet twice daily for leg cramps   METOPROLOL (LOPRESSOR) 50 MG TABLET    TAKE 1 TABLET BY MOUTH  DAILY FOR BLOOD PRESSURE (TAKE WITH A SNACK)   MULTIPLE VITAMINS-MINERALS ([employer]S SPECTRAVITE PO)    Take by mouth. One daily for eyes   NAPROXEN SODIUM (ALEVE) 220 MG TABLET    Take 220 mg by mouth. Take two tablets daily as needed for pain   NON FORMULARY    Please provide a home health aide for a duration of 2 weeks to help the patient get out of bed, ambulate in the morning.   POLYETHYLENE GLYCOL POWDER (GLYCOLAX/MIRALAX) POWDER    Take 17 g by mouth daily as needed.   RALOXIFENE (EVISTA) 60 MG TABLET    Take 1 tablet (60 mg total) by mouth daily. To treat osteoporosis   SODIUM CHLORIDE (MURO 128) 5 % OPHTHALMIC SOLUTION    Place 1 drop into the right eye 2 (two) times daily.    TRAMADOL (ULTRAM) 50 MG TABLET    Take by mouth. Take one tablet every 12 hours for pain   -  Dr. Paulla Fore  Modified Medications   No medications on file  Discontinued Medications   CALCITONIN, SALMON, (MIACALCIN/FORTICAL) 200 UNIT/ACT NASAL SPRAY    USE ONE SPRAY IN ONE NOSTRIL DAILY. ALTERNATE NOSTRILS   HYDROCHLOROTHIAZIDE (HYDRODIURIL) 25  MG TABLET    TAKE 1 TABLET (25 MG TOTAL) BY MOUTH DAILY. FOR BLOOD PRESSURE   METRONIDAZOLE (METROGEL) 0.75 % GEL    Apply topically twice daly to help rash   SENNOSIDES-DOCUSATE SODIUM (SENOKOT-S) 8.6-50 MG TABLET    Take 1 tablet by mouth daily.   VITAMIN C (ASCORBIC ACID) 500 MG TABLET    Take 500 mg by mouth daily.     Review of Systems  Constitutional: Positive for fatigue. Negative for activity change, appetite change, chills, diaphoresis, fever and unexpected weight change.  HENT: Positive for hearing loss. Negative for congestion, ear pain, mouth sores, rhinorrhea, sinus pressure and sore throat.   Eyes: Positive for visual disturbance (Rx lenses).       Ocular migraine right eye.  Respiratory: Negative for cough, chest tightness, wheezing and stridor.   Cardiovascular: Negative for chest pain, palpitations and leg swelling.  Gastrointestinal: Negative for abdominal  distention, abdominal pain, constipation, diarrhea and nausea.       Large Cyst in liver. Patient has seen Dr. Laurence Spates and Dr. Stark Klein. They recommend that we just watch this for the time being.  Endocrine: Negative.  Negative for cold intolerance, heat intolerance and polyuria.  Genitourinary:       Chronic leakage. Has had urologic evaluation. Renal cyst  Musculoskeletal: Positive for back pain, gait problem (unsteady balance) and neck pain. Negative for arthralgias, joint swelling, myalgias and neck stiffness.       Possible fracture of the vertebral body at L1 and L2.  Skin:       Chronic venous insufficiency with multiple superficial varicosities. Chronic edema that worsens during the day. Recurrent issues of erythema of the feet, occasional blistering, and pruritus towards the end of the day.  Allergic/Immunologic: Negative.   Neurological: Positive for weakness. Negative for dizziness, tremors, seizures, syncope, facial asymmetry, speech difficulty, light-headedness, numbness and headaches.       Some balance issues. Hx of falls.  Has noted numbness and some tingling in the feet and lower legs. Fingers and hands seem to be spared.  CT scan of the brain in March 2016 showed small vessel disease, but was otherwise normal.  Hematological: Negative.   Psychiatric/Behavioral: Positive for dysphoric mood. The patient is nervous/anxious.        Constant worries about what the future holds for her.    Vitals:   12/02/16 0932  BP: 120/84  Pulse: 75  Temp: 97.6 F (36.4 C)  TempSrc: Oral  SpO2: 99%  Weight: 128 lb (58.1 kg)  Height: 5' 4"  (1.626 m)   Wt Readings from Last 3 Encounters:  12/02/16 128 lb (58.1 kg)  10/21/16 129 lb (58.5 kg)  10/07/16 137 lb (62.1 kg)    Body mass index is 21.97 kg/m.  Physical Exam  Constitutional: She is oriented to person, place, and time. She appears well-developed and well-nourished. No distress.  HENT:  Head: Normocephalic and  atraumatic.  Right Ear: External ear normal.  Left Ear: External ear normal.  Nose: Nose normal.  Mouth/Throat: Oropharynx is clear and moist.  Eyes: Conjunctivae and EOM are normal. Pupils are equal, round, and reactive to light.  Corrective lenses.  Neck: Neck supple. No JVD present. No tracheal deviation present. No thyromegaly present.  Cardiovascular: Normal rate, regular rhythm, normal heart sounds and intact distal pulses.  Exam reveals no gallop and no friction rub.   No murmur heard. Pulmonary/Chest: No respiratory distress. She has no wheezes. She has no rales. She  exhibits no tenderness.  Abdominal: She exhibits no distension and no mass. There is tenderness. There is guarding.  History of large cyst of the liver. She has seen both a gastroenterologist, Edwards, and a general surgeon, Dr. Stark Klein.  Musculoskeletal: Normal range of motion. She exhibits no edema or tenderness.  Hx of Rupture of the head of the right biceps laterally. Lumbar discomfort in the mid and right lumbar areas. Unstable gait. Using 4 wheel walker with seat and brakes.  Lymphadenopathy:    She has no cervical adenopathy.  Neurological: She is alert and oriented to person, place, and time. She has normal reflexes. No cranial nerve deficit. Coordination normal.  02/05/16 MMSE 30/30. Passed clock drawing. 08/26/16 MMSE 28/30. Passed clock drawing.  Skin: No rash noted. No erythema. No pallor.  There are superficial varicosities and mild swelling of both feet. Reddish discoloration of the right distal forefoot close to the interdigital spaces.  Psychiatric: Her behavior is normal. Judgment and thought content normal.  chronic anxiety and mild depression     Labs reviewed: Lab Summary Latest Ref Rng & Units 06/02/2016 03/13/2016 02/02/2016  Hemoglobin 12.2 - 16.2 g/dL (None) (None) 12.0(A)  Hematocrit 37.7 - 47.9 % (None) (None) 34.6(A)  White count 4.6 - 10.2 K/uL (None) (None) 10.7(A)  Platelet count -  (None) (None) (None)  Sodium 137 - 147 mmol/L 135(A) 137 128(L)  Potassium 3.4 - 5.3 mmol/L 4.3 4.5 3.5  Calcium 8.6 - 10.4 mg/dL (None) (None) 9.1  Phosphorus - (None) (None) (None)  Creatinine 0.5 - 1.1 mg/dL 0.7 0.7 0.54(L)  AST 13 - 35 U/L 26 (None) 28  Alk Phos 25 - 125 U/L 119 (None) 86  Bilirubin 0.2 - 1.2 mg/dL (None) (None) 0.8  Glucose mg/dL 89 86 99  Cholesterol - (None) (None) (None)  HDL cholesterol - (None) (None) (None)  Triglycerides - (None) (None) (None)  LDL Direct - (None) (None) (None)  LDL Calc - (None) (None) (None)  Total protein 6.1 - 8.1 g/dL (None) (None) 6.7  Albumin 3.6 - 5.1 g/dL (None) (None) 3.5(L)  Some recent data might be hidden   Lab Results  Component Value Date   TSH 4.00 11/29/2015   Lab Results  Component Value Date   BUN 15 06/02/2016   BUN 15 03/13/2016   BUN 11 02/02/2016   Lab Results  Component Value Date   CREATININE 0.7 06/02/2016   CREATININE 0.7 03/13/2016   CREATININE 0.54 (L) 02/02/2016   No results found for: HGBA1C     Assessment/Plan  1. Adjustment disorder with mixed anxiety and depressed mood Continue Celexa 40 mg qd  2. Essential hypertension controlled  3. Edema, unspecified type improved  4. Memory disturbance -MMSE next visit

## 2016-12-03 DIAGNOSIS — H16402 Unspecified corneal neovascularization, left eye: Secondary | ICD-10-CM | POA: Diagnosis not present

## 2016-12-03 DIAGNOSIS — H52221 Regular astigmatism, right eye: Secondary | ICD-10-CM | POA: Diagnosis not present

## 2016-12-03 DIAGNOSIS — H04123 Dry eye syndrome of bilateral lacrimal glands: Secondary | ICD-10-CM | POA: Diagnosis not present

## 2016-12-03 DIAGNOSIS — H532 Diplopia: Secondary | ICD-10-CM | POA: Diagnosis not present

## 2016-12-03 DIAGNOSIS — H1851 Endothelial corneal dystrophy: Secondary | ICD-10-CM | POA: Diagnosis not present

## 2016-12-03 DIAGNOSIS — H16102 Unspecified superficial keratitis, left eye: Secondary | ICD-10-CM | POA: Diagnosis not present

## 2016-12-03 DIAGNOSIS — H353132 Nonexudative age-related macular degeneration, bilateral, intermediate dry stage: Secondary | ICD-10-CM | POA: Diagnosis not present

## 2016-12-03 DIAGNOSIS — H524 Presbyopia: Secondary | ICD-10-CM | POA: Diagnosis not present

## 2016-12-03 DIAGNOSIS — H35033 Hypertensive retinopathy, bilateral: Secondary | ICD-10-CM | POA: Diagnosis not present

## 2016-12-03 DIAGNOSIS — Z947 Corneal transplant status: Secondary | ICD-10-CM | POA: Diagnosis not present

## 2016-12-30 ENCOUNTER — Encounter: Payer: Self-pay | Admitting: Internal Medicine

## 2017-01-19 ENCOUNTER — Telehealth: Payer: Self-pay | Admitting: *Deleted

## 2017-01-19 DIAGNOSIS — F4323 Adjustment disorder with mixed anxiety and depressed mood: Secondary | ICD-10-CM

## 2017-01-19 MED ORDER — CITALOPRAM HYDROBROMIDE 20 MG PO TABS: ORAL_TABLET | ORAL | 0 refills | Status: DC

## 2017-01-19 NOTE — Telephone Encounter (Signed)
Patient daughter, Hinton Dyer called and stated that she had medication dosage concerns. Stated that she was up this weekend visiting and reviewed patient's medications and patient is not taking Celexa 40mg  that she is only taking 20mg  and doing well. Takes 20mg  every evening. Daughter wanted to let you know she is doing very well on this. 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   Medication list updated.

## 2017-01-19 NOTE — Telephone Encounter (Signed)
It is OK to continue the 20 mg Celexa.

## 2017-01-23 DIAGNOSIS — N390 Urinary tract infection, site not specified: Secondary | ICD-10-CM | POA: Diagnosis not present

## 2017-01-23 DIAGNOSIS — R3915 Urgency of urination: Secondary | ICD-10-CM | POA: Diagnosis not present

## 2017-02-09 ENCOUNTER — Other Ambulatory Visit: Payer: Self-pay | Admitting: *Deleted

## 2017-02-09 MED ORDER — RALOXIFENE HCL 60 MG PO TABS: 60.0000 mg | ORAL_TABLET | Freq: Every day | ORAL | 3 refills | Status: DC

## 2017-02-09 NOTE — Telephone Encounter (Signed)
Kizzie Fantasia Request for 90 day supply

## 2017-02-24 ENCOUNTER — Non-Acute Institutional Stay: Payer: Medicare Other | Admitting: Internal Medicine

## 2017-02-24 ENCOUNTER — Encounter: Payer: Self-pay | Admitting: Internal Medicine

## 2017-02-24 VITALS — BP 138/82 | HR 62 | Temp 97.2°F | Ht 64.0 in | Wt 131.0 lb

## 2017-02-24 DIAGNOSIS — I10 Essential (primary) hypertension: Secondary | ICD-10-CM

## 2017-02-24 DIAGNOSIS — L989 Disorder of the skin and subcutaneous tissue, unspecified: Secondary | ICD-10-CM | POA: Diagnosis not present

## 2017-02-24 DIAGNOSIS — R413 Other amnesia: Secondary | ICD-10-CM

## 2017-02-24 DIAGNOSIS — S81811D Laceration without foreign body, right lower leg, subsequent encounter: Secondary | ICD-10-CM | POA: Diagnosis not present

## 2017-02-24 DIAGNOSIS — M48061 Spinal stenosis, lumbar region without neurogenic claudication: Secondary | ICD-10-CM

## 2017-02-24 NOTE — Progress Notes (Addendum)
Facility  [facility]    Place of Service: [facility] ([facility])     Allergies  Allergen Reactions  . Ibuprofen Nausea Only  . Macrobid [Nitrofurantoin Monohyd Macro]     Swelling and flush   . Sulfa Antibiotics     Chief Complaint  Patient presents with  . Acute Visit    cut on right lower leg . About 2 weeks ago lost balance in bathroom, cut leg on edge of cabinet.     HPI:  Laceration of right lower extremity, subsequent encounter - occurred about 2 seeks ago. It is healing and is dry. Painless. No infection.  Memory disturbance - gradual worsening. Scheduled for MMSE in April 2018.  Spinal stenosis, lumbar region, without neurogenic claudication - back painsd have improved  Essential hypertension - controlled  Left leg skin lesion posteriorly near the knee. Noted about 2 weeks ago. Sits on top f a nest of superficial varicosities. There is a lump of tissue and it appears to heve bled in the past.  Medications: Patient's Medications  New Prescriptions   No medications on file  Previous Medications   CALCIUM CARB-CHOLECALCIFEROL (CALCIUM + D3) 600-200 MG-UNIT TABS    Take by mouth. Take one tablet twice daily   CHOLECALCIFEROL (VITAMIN D-400 PO)    Take by mouth. Take one daily for vitamin D supplement   CITALOPRAM (CELEXA) 20 MG TABLET    One daily to help depression and anxiety   CYCLOSPORINE (RESTASIS) 0.05 % OPHTHALMIC EMULSION    1 drop every 12 (twelve) hours. One drop into left eye twice daily   ESTRADIOL (ESTRACE) 0.1 MG/GM VAGINAL CREAM    Apply 1 gm to vaginal area 3 times a week as directed.   LOSARTAN (COZAAR) 100 MG TABLET    TAKE ONE TABLET BY MOUTH  DAILY TO LOWER BLOOD PRESSURE  (NEW DOSE)   MAGNESIUM 400 MG CAPS    Take by mouth. Take one tablet twice daily for leg cramps   METOPROLOL (LOPRESSOR) 50 MG TABLET    TAKE 1 TABLET BY MOUTH DAILY FOR BLOOD PRESSURE (TAKE WITH A SNACK)   MULTIPLE VITAMINS-MINERALS ([employer] SPECTRAVITE PO)    Take by mouth. One daily for eyes     NAPROXEN SODIUM (ALEVE) 220 MG TABLET    Take 220 mg by mouth. Take two tablets daily as needed for pain   NON FORMULARY    Please provide a home health aide for a duration of 2 weeks to help the patient get out of bed, ambulate in the morning.   POLYETHYLENE GLYCOL POWDER (GLYCOLAX/MIRALAX) POWDER    Take 17 g by mouth daily as needed.   RALOXIFENE (EVISTA) 60 MG TABLET    Take 1 tablet (60 mg total) by mouth daily. To treat osteoporosis   SODIUM CHLORIDE (MURO 128) 5 % OPHTHALMIC SOLUTION    Place 1 drop into the right eye 2 (two) times daily.   Modified Medications   No medications on file  Discontinued Medications   AMBULATORY NON FORMULARY MEDICATION    Please [place] for an additional 2 weeks to help the patient get out of bed, ambulate in the morning   DESONIDE (DESOWEN) 0.05 % CREAM    Apply topically 2 (two) times daily. Reported on 02/02/2016   TRAMADOL (ULTRAM) 50 MG TABLET    Take by mouth. Take one tablet every 12 hours for pain   -  Dr. Paulla Fore     Review of Systems  Constitutional: Positive for fatigue. Negative for activity change, appetite change, chills, diaphoresis, fever and unexpected weight change.  HENT: Positive for hearing loss. Negative for congestion, ear pain, mouth sores, rhinorrhea, sinus pressure and sore throat.   Eyes: Positive for visual disturbance (Rx lenses).       Ocular migraine right eye.  Respiratory: Negative for cough, chest tightness, wheezing and stridor.   Cardiovascular: Negative for chest pain, palpitations and leg swelling.  Gastrointestinal: Negative for abdominal distention, abdominal pain, constipation, diarrhea and nausea.       Large Cyst in liver. Patient has seen Dr. Laurence Spates and Dr. Stark Klein. They recommend that we just watch this for the time being.  Endocrine: Negative.  Negative for cold intolerance, heat intolerance and polyuria.  Genitourinary:       Chronic leakage. Has had urologic evaluation. Renal  cyst  Musculoskeletal: Positive for back pain, gait problem (unsteady balance) and neck pain. Negative for arthralgias, joint swelling, myalgias and neck stiffness.       Possible fracture of the vertebral body at L1 and L2.  Skin:       Chronic venous insufficiency with multiple superficial varicosities. Chronic edema that worsens during the day. Recurrent issues of erythema of the feet, occasional blistering, and pruritus towards the end of the day. Skin tear/ laceration of the right shi that is dry and healing. Lesion o fthe left leg posteriorly near the knee with a lumped up feeling on palpation.  Allergic/Immunologic: Negative.   Neurological: Positive for weakness. Negative for dizziness, tremors, seizures, syncope, facial asymmetry, speech difficulty, light-headedness, numbness and headaches.       Increasing memory deficit. Some balance issues. Hx of falls.  Has noted numbness and some tingling in the feet and lower legs. Fingers and hands seem to be spared.  CT scan of the brain in March 2016 showed small vessel disease, but was otherwise normal.  Hematological: Negative.   Psychiatric/Behavioral: Positive for confusion, decreased concentration and dysphoric mood. The patient is nervous/anxious.        Constant worries about what the future holds for her.    Vitals:   02/24/17 1041  BP: 138/82  Pulse: 62  Temp: 97.2 F (36.2 C)  TempSrc: Oral  SpO2: 98%  Weight: 131 lb (59.4 kg)  Height: _0  (1.626 m)   Wt Readings from Last 3 Encounters:  02/24/17 131 lb (59.4 kg)  12/02/16 128 lb (58.1 kg)  10/21/16 129 lb (58.5 kg)    Body mass index is 22.49 kg/m.  Physical Exam  Constitutional: She is oriented to person, place, and time. She appears well-developed and well-nourished. No distress.  HENT:  Head: Normocephalic and atraumatic.  Right Ear: External ear normal.  Left Ear: External ear normal.  Nose: Nose normal.  Mouth/Throat: Oropharynx is clear and moist.    Eyes: Conjunctivae and EOM are normal. Pupils are equal, round, and reactive to light.  Corrective lenses.  Neck: Neck supple. No JVD present. No tracheal deviation present. No thyromegaly present.  Cardiovascular: Normal rate, regular rhythm, normal heart sounds and intact distal pulses.  Exam reveals no gallop and no friction rub.   No murmur heard. Pulmonary/Chest: No respiratory distress. She has no wheezes. She has no rales. She exhibits no tenderness.  Abdominal: She exhibits no distension and no mass. There is tenderness. There is guarding.  History of large cyst of the liver. She has seen both a gastroenterologist, Edwards, and a general surgeon, Dr. Stark Klein.  Musculoskeletal: Normal range of motion. She exhibits no edema or tenderness.  Hx of Rupture of the head of the right biceps laterally. Unstable gait. Using 4 wheel walker with seat and brakes.  Lymphadenopathy:    She has no cervical adenopathy.  Neurological: She is alert and oriented to person, place, and time. She has normal reflexes. No cranial nerve deficit. Coordination normal.  02/05/16 MMSE 30/30. Passed clock drawing. 08/26/16 MMSE 28/30. Passed clock drawing.  Skin: No rash noted. No erythema. No pallor.  There are superficial varicosities and mild swelling of both feet. Reddish discoloration of the right distal forefoot close to the interdigital spaces. Dry healing laceration of the right shin area. Lumped up lesion with a previously bleeding point in the middle that is over a nest f superficial varicosities. Suspicious for skin cancer.  Psychiatric: Her behavior is normal. Judgment and thought content normal.  chronic anxiety and mild depression     Labs reviewed: Lab Summary Latest Ref Rng & Units 06/02/2016 03/13/2016 02/02/2016  Hemoglobin 12.2 - 16.2 g/dL (None) (None) 12.0(A)  Hematocrit 37.7 - 47.9 % (None) (None) 34.6(A)  White count 4.6 - 10.2 K/uL (None) (None) 10.7(A)  Platelet count - (None)  (None) (None)  Sodium 137 - 147 mmol/L 135(A) 137 128(L)  Potassium 3.4 - 5.3 mmol/L 4.3 4.5 3.5  Calcium 8.6 - 10.4 mg/dL (None) (None) 9.1  Phosphorus - (None) (None) (None)  Creatinine 0.5 - 1.1 mg/dL 0.7 0.7 0.54(L)  AST 13 - 35 U/L 26 (None) 28  Alk Phos 25 - 125 U/L 119 (None) 86  Bilirubin 0.2 - 1.2 mg/dL (None) (None) 0.8  Glucose mg/dL 89 86 99  Cholesterol - (None) (None) (None)  HDL cholesterol - (None) (None) (None)  Triglycerides - (None) (None) (None)  LDL Direct - (None) (None) (None)  LDL Calc - (None) (None) (None)  Total protein 6.1 - 8.1 g/dL (None) (None) 6.7  Albumin 3.6 - 5.1 g/dL (None) (None) 3.5(L)  Some recent data might be hidden   Lab Results  Component Value Date   TSH 4.00 11/29/2015   Lab Results  Component Value Date   BUN 15 06/02/2016   BUN 15 03/13/2016   BUN 11 02/02/2016   Lab Results  Component Value Date   CREATININE 0.7 06/02/2016   CREATININE 0.7 03/13/2016   CREATININE 0.54 (L) 02/02/2016   No results found for: HGBA1C     Assessment/Plan  1. Laceration of right lower extremity, subsequent encounter healing  2. Memory disturbance MMSE at next visit  3. Spinal stenosis, lumbar region, without neurogenic claudication Less discomfort in the back  4. Essential hypertension controlled  5. Leg skin lesion, left Suspicious for skin cancer. Recommended Derm referral for excision. She prefers to watch it until her next visit 03/31/17.

## 2017-03-03 DIAGNOSIS — C44729 Squamous cell carcinoma of skin of left lower limb, including hip: Secondary | ICD-10-CM | POA: Diagnosis not present

## 2017-03-09 ENCOUNTER — Other Ambulatory Visit: Payer: Self-pay | Admitting: *Deleted

## 2017-03-09 MED ORDER — LOSARTAN POTASSIUM 100 MG PO TABS: ORAL_TABLET | ORAL | 3 refills | Status: DC

## 2017-03-09 NOTE — Telephone Encounter (Signed)
[employer] [facility] 

## 2017-03-16 DIAGNOSIS — Z87440 Personal history of urinary (tract) infections: Secondary | ICD-10-CM | POA: Diagnosis not present

## 2017-03-31 ENCOUNTER — Non-Acute Institutional Stay: Payer: Medicare Other | Admitting: Internal Medicine

## 2017-03-31 ENCOUNTER — Encounter: Payer: Self-pay | Admitting: Internal Medicine

## 2017-03-31 VITALS — BP 164/94 | HR 58 | Temp 97.4°F | Ht 64.0 in | Wt 131.0 lb

## 2017-03-31 DIAGNOSIS — I10 Essential (primary) hypertension: Secondary | ICD-10-CM

## 2017-03-31 DIAGNOSIS — E039 Hypothyroidism, unspecified: Secondary | ICD-10-CM | POA: Diagnosis not present

## 2017-03-31 DIAGNOSIS — R2681 Unsteadiness on feet: Secondary | ICD-10-CM | POA: Diagnosis not present

## 2017-03-31 DIAGNOSIS — F4323 Adjustment disorder with mixed anxiety and depressed mood: Secondary | ICD-10-CM

## 2017-03-31 DIAGNOSIS — R609 Edema, unspecified: Secondary | ICD-10-CM | POA: Diagnosis not present

## 2017-03-31 DIAGNOSIS — M545 Low back pain, unspecified: Secondary | ICD-10-CM

## 2017-03-31 DIAGNOSIS — L989 Disorder of the skin and subcutaneous tissue, unspecified: Secondary | ICD-10-CM

## 2017-03-31 DIAGNOSIS — R413 Other amnesia: Secondary | ICD-10-CM | POA: Diagnosis not present

## 2017-03-31 DIAGNOSIS — Z9181 History of falling: Secondary | ICD-10-CM | POA: Diagnosis not present

## 2017-03-31 NOTE — Progress Notes (Signed)
Facility  [facility]    Place of Service: Clinic (12)     Allergies  Allergen Reactions  . Ibuprofen Nausea Only  . Macrobid [Nitrofurantoin Monohyd Macro]     Swelling and flush   . Sulfa Antibiotics     Chief Complaint  Patient presents with  . Medical Management of Chronic Issues    4 month medication management memory, anxiety, depression, blood pressure, edema    HPI:  Planning to move to [place] to be closer to her daughter in about 2 months.. Will move to another facility, [facility] , outside of [place].  Memory disturbance - she in convinced it is worsening. MMSE score is about the same.  Adjustment disorder with mixed anxiety and depressed mood - stable/ improved on current medication  Essential hypertension - mild elevation in the SBP. No headache, chest pain,, or palpitagtions  Edema, unspecified type - resolved  History of fall - none recent  Hypothyroidism, unspecified type - compensated  Unstable gait - 4 wheel walker with seat ant  brakes  Acute right-sided low back pain without sciatica - resolved  Leg skin lesion, left - skin cancer removed by Dr. Allyson Sabal. Healing. Still with rolled edges. Lesion is over a cluster of varices.  Lesion on the left forearm with waxy material. It is nontender and cleft.    Medications: Patient's Medications  New Prescriptions   No medications on file  Previous Medications   CALCIUM CARB-CHOLECALCIFEROL (CALCIUM + D3) 600-200 MG-UNIT TABS    Take by mouth. Take one tablet twice daily   CHOLECALCIFEROL (VITAMIN D-400 PO)    Take by mouth. Take one daily for vitamin D supplement   CITALOPRAM (CELEXA) 20 MG TABLET    One daily to help depression and anxiety   CYCLOSPORINE (RESTASIS) 0.05 % OPHTHALMIC EMULSION    1 drop every 12 (twelve) hours. One drop into left eye twice daily   ESTRADIOL (ESTRACE) 0.1 MG/GM VAGINAL CREAM    Apply 1 gm to vaginal area 3 times a week as directed.   LOSARTAN (COZAAR) 100 MG TABLET     Take one tablet by mouth once daily to lower blood pressure   MAGNESIUM 400 MG CAPS    Take by mouth. Take one tablet twice daily for leg cramps   METOPROLOL (LOPRESSOR) 50 MG TABLET    TAKE 1 TABLET BY MOUTH DAILY FOR BLOOD PRESSURE (TAKE WITH A SNACK)   MULTIPLE VITAMINS-MINERALS ([employer]S SPECTRAVITE PO)    Take by mouth. One daily for eyes   NAPROXEN SODIUM (ALEVE) 220 MG TABLET    Take 220 mg by mouth. Take two tablets daily as needed for pain   NON FORMULARY    Please provide a home health aide for a duration of 2 weeks to help the patient get out of bed, ambulate in the morning.   POLYETHYLENE GLYCOL POWDER (GLYCOLAX/MIRALAX) POWDER    Take 17 g by mouth daily as needed.   RALOXIFENE (EVISTA) 60 MG TABLET    Take 1 tablet (60 mg total) by mouth daily. To treat osteoporosis   SODIUM CHLORIDE (MURO 128) 5 % OPHTHALMIC SOLUTION    Place 1 drop into the right eye 2 (two) times daily.   Modified Medications   No medications on file  Discontinued Medications   No medications on file     Review of Systems  Constitutional: Positive for fatigue. Negative for activity change, appetite change, chills, diaphoresis, fever and unexpected weight change.  HENT: Positive  for hearing loss. Negative for congestion, ear pain, mouth sores, rhinorrhea, sinus pressure and sore throat.   Eyes: Positive for visual disturbance (Rx lenses).       Ocular migraine right eye.  Respiratory: Negative for cough, chest tightness, wheezing and stridor.   Cardiovascular: Negative for chest pain, palpitations and leg swelling.  Gastrointestinal: Negative for abdominal distention, abdominal pain, constipation, diarrhea and nausea.       Large Cyst in liver. Patient has seen Dr. Laurence Spates and Dr. Stark Klein. They recommend that we just watch this for the time being.  Endocrine: Negative.  Negative for cold intolerance, heat intolerance and polyuria.  Genitourinary:       Chronic leakage. Has had urologic  evaluation. Renal cyst  Musculoskeletal: Positive for back pain, gait problem (unsteady balance) and neck pain. Negative for arthralgias, joint swelling, myalgias and neck stiffness.       Possible fracture of the vertebral body at L1 and L2.  Skin:       Chronic venous insufficiency with multiple superficial varicosities. Chronic edema that worsens during the day. Recurrent issues of erythema of the feet, occasional blistering, and pruritus towards the end of the day. Skin tear/ laceration of the right shin that is dry and healing. Lesion of the left leg posteriorly near the knee with a lumped up feeling on palpation has been excised and was a cancer. Still has lumped up sides, but it is healing. Lesion of the left forearm with cleft appearance and waxy material under the skin.  Allergic/Immunologic: Negative.   Neurological: Positive for weakness. Negative for dizziness, tremors, seizures, syncope, facial asymmetry, speech difficulty, light-headedness, numbness and headaches.       Increasing memory deficit. Some balance issues. Hx of falls.  Has noted numbness and some tingling in the feet and lower legs. Fingers and hands seem to be spared.  CT scan of the brain in March 2016 showed small vessel disease, but was otherwise normal.  Hematological: Negative.   Psychiatric/Behavioral: Positive for confusion, decreased concentration and dysphoric mood. The patient is nervous/anxious.        Constant worries about what the future holds for her.    Vitals:   03/31/17 1045  BP: (!) 164/94  Pulse: (!) 58  Temp: 97.4 F (36.3 C)  TempSrc: Oral  SpO2: 98%  Weight: 131 lb (59.4 kg)  Height: _0  (1.626 m)   Wt Readings from Last 3 Encounters:  03/31/17 131 lb (59.4 kg)  02/24/17 131 lb (59.4 kg)  12/02/16 128 lb (58.1 kg)    Body mass index is 22.49 kg/m.  Physical Exam  Constitutional: She is oriented to person, place, and time. She appears well-developed and well-nourished. No  distress.  HENT:  Head: Normocephalic and atraumatic.  Right Ear: External ear normal.  Left Ear: External ear normal.  Nose: Nose normal.  Mouth/Throat: Oropharynx is clear and moist.  Eyes: Conjunctivae and EOM are normal. Pupils are equal, round, and reactive to light.  Corrective lenses.  Neck: Neck supple. No JVD present. No tracheal deviation present. No thyromegaly present.  Cardiovascular: Normal rate, regular rhythm, normal heart sounds and intact distal pulses.  Exam reveals no gallop and no friction rub.   No murmur heard. Pulmonary/Chest: No respiratory distress. She has no wheezes. She has no rales. She exhibits no tenderness.  Abdominal: She exhibits no distension and no mass. There is tenderness. There is guarding.  History of large cyst of the liver. She has seen both  a gastroenterologist, Oletta Lamas, and a general surgeon, Dr. Stark Klein.  Musculoskeletal: Normal range of motion. She exhibits no edema or tenderness.  Hx of Rupture of the head of the right biceps laterally. Unstable gait. Using 4 wheel walker with seat and brakes.  Lymphadenopathy:    She has no cervical adenopathy.  Neurological: She is alert and oriented to person, place, and time. She has normal reflexes. No cranial nerve deficit. Coordination normal.  02/05/16 MMSE 30/30. Passed clock drawing. 08/26/16 MMSE 28/30. Passed clock drawing. 03/30/17 MMSE 27/30. Passed clock drawing.  Skin: No rash noted. No erythema. No pallor.  There are superficial varicosities and mild swelling of both feet. Reddish discoloration of the right distal forefoot close to the interdigital spaces. Dry healing laceration of the right shin area. Lumped up lesion with a previously bleeding point in the middle that is over a nest f superficial varicosities. Suspicious for skin cancer.  Psychiatric: Her behavior is normal. Judgment and thought content normal.  chronic anxiety and mild depression     Labs reviewed: Lab Summary  Latest Ref Rng & Units 06/02/2016 03/13/2016 02/02/2016  Hemoglobin 12.2 - 16.2 g/dL (None) (None) 12.0(A)  Hematocrit 37.7 - 47.9 % (None) (None) 34.6(A)  White count 4.6 - 10.2 K/uL (None) (None) 10.7(A)  Platelet count - (None) (None) (None)  Sodium 137 - 147 mmol/L 135(A) 137 128(L)  Potassium 3.4 - 5.3 mmol/L 4.3 4.5 3.5  Calcium 8.6 - 10.4 mg/dL (None) (None) 9.1  Phosphorus - (None) (None) (None)  Creatinine 0.5 - 1.1 mg/dL 0.7 0.7 0.54(L)  AST 13 - 35 U/L 26 (None) 28  Alk Phos 25 - 125 U/L 119 (None) 86  Bilirubin 0.2 - 1.2 mg/dL (None) (None) 0.8  Glucose mg/dL 89 86 99  Cholesterol - (None) (None) (None)  HDL cholesterol - (None) (None) (None)  Triglycerides - (None) (None) (None)  LDL Direct - (None) (None) (None)  LDL Calc - (None) (None) (None)  Total protein 6.1 - 8.1 g/dL (None) (None) 6.7  Albumin 3.6 - 5.1 g/dL (None) (None) 3.5(L)  Some recent data might be hidden   Lab Results  Component Value Date   TSH 4.00 11/29/2015   Lab Results  Component Value Date   BUN 15 06/02/2016   BUN 15 03/13/2016   BUN 11 02/02/2016   Lab Results  Component Value Date   CREATININE 0.7 06/02/2016   CREATININE 0.7 03/13/2016   CREATININE 0.54 (L) 02/02/2016   No results found for: HGBA1C     Assessment/Plan  1. Memory disturbance Continue to monitor in the future. Not currently medicated, but she may be a candidate in the future if MMSE drops further.  2. Adjustment disorder with mixed anxiety and depressed mood Improved on citalopram.  3. Essential hypertension Mild elevation of the SBP. I have not increased medication today.  4. Edema, unspecified type resolved  5. History of fall None recently  6. Hypothyroidism, unspecified type compensaated  7. Unstable gait continue to use walker with wheels and brakes  8. Acute right-sided low back pain without sciatica resolved  9. Leg skin lesion, left Skin cancer was removed, but there is a lumped and  rolled edge. She has follow up visit with Dr. Allyson Sabal.,  10. Skin lesion of left arm Possible skin cancer. She will ask Dr. Allyson Sabal to look at it at the next visit

## 2017-04-20 DIAGNOSIS — L858 Other specified epidermal thickening: Secondary | ICD-10-CM | POA: Diagnosis not present

## 2017-04-20 DIAGNOSIS — S40812A Abrasion of left upper arm, initial encounter: Secondary | ICD-10-CM | POA: Diagnosis not present

## 2017-04-20 DIAGNOSIS — C44629 Squamous cell carcinoma of skin of left upper limb, including shoulder: Secondary | ICD-10-CM | POA: Diagnosis not present

## 2017-04-20 DIAGNOSIS — Z85828 Personal history of other malignant neoplasm of skin: Secondary | ICD-10-CM | POA: Diagnosis not present

## 2017-04-27 ENCOUNTER — Telehealth: Payer: Self-pay | Admitting: *Deleted

## 2017-04-27 NOTE — Telephone Encounter (Signed)
Hinton Dyer, daughter called and stated that she is moving her mother to [place] with her to a facility close to her there and wanted to know when patient's last pneumonia vaccine was. Information given.

## 2017-04-30 ENCOUNTER — Telehealth: Payer: Self-pay

## 2017-04-30 NOTE — Telephone Encounter (Signed)
Spoke with patients daughter she stated that she needs form filled out as soon as possible because she has to submit forms to her job as soon as possible. Forms are placed in Jessica's review and sign folder.

## 2017-04-30 NOTE — Telephone Encounter (Signed)
Request for family leave forms placed in [place] review and sign folder.

## 2017-04-30 NOTE — Telephone Encounter (Signed)
Left voicemail for patients daughter to give office a call to office.

## 2017-04-30 NOTE — Telephone Encounter (Signed)
The paperwork was completed by Janett Billow and it was faxed to [facility] at [phone].

## 2017-05-02 ENCOUNTER — Other Ambulatory Visit: Payer: Self-pay | Admitting: Internal Medicine

## 2017-05-02 DIAGNOSIS — F4323 Adjustment disorder with mixed anxiety and depressed mood: Secondary | ICD-10-CM

## 2017-05-05 ENCOUNTER — Other Ambulatory Visit: Payer: Self-pay | Admitting: *Deleted

## 2017-05-05 ENCOUNTER — Ambulatory Visit (INDEPENDENT_AMBULATORY_CARE_PROVIDER_SITE_OTHER): Payer: Medicare Other | Admitting: Nurse Practitioner

## 2017-05-05 VITALS — HR 61 | Temp 98.1°F | Resp 17

## 2017-05-05 DIAGNOSIS — Z111 Encounter for screening for respiratory tuberculosis: Secondary | ICD-10-CM

## 2017-05-05 DIAGNOSIS — F4323 Adjustment disorder with mixed anxiety and depressed mood: Secondary | ICD-10-CM

## 2017-05-05 MED ORDER — CITALOPRAM HYDROBROMIDE 20 MG PO TABS: ORAL_TABLET | ORAL | 5 refills | Status: AC

## 2017-05-05 NOTE — Progress Notes (Signed)
   Pt was seen for administration of PPD test. Test was performed on left forearm. Pt is to return in 2 days to have test read.

## 2017-05-05 NOTE — Telephone Encounter (Signed)
Gina Bolton 

## 2017-05-07 ENCOUNTER — Ambulatory Visit (INDEPENDENT_AMBULATORY_CARE_PROVIDER_SITE_OTHER): Payer: Medicare Other | Admitting: Nurse Practitioner

## 2017-05-07 VITALS — HR 71 | Temp 98.0°F | Resp 17

## 2017-05-07 DIAGNOSIS — Z111 Encounter for screening for respiratory tuberculosis: Secondary | ICD-10-CM

## 2017-05-07 LAB — TB SKIN TEST
Induration: 0 mm
TB Skin Test: NEGATIVE

## 2017-05-07 NOTE — Progress Notes (Signed)
    Patient came in to have PPD test read. PPD was administered on 05/05/17 and the test is negative. Patient was given a copy of results.

## 2017-05-19 DIAGNOSIS — H353132 Nonexudative age-related macular degeneration, bilateral, intermediate dry stage: Secondary | ICD-10-CM | POA: Diagnosis not present

## 2017-05-19 DIAGNOSIS — H1851 Endothelial corneal dystrophy: Secondary | ICD-10-CM | POA: Diagnosis not present

## 2017-05-19 DIAGNOSIS — H16402 Unspecified corneal neovascularization, left eye: Secondary | ICD-10-CM | POA: Diagnosis not present

## 2017-05-19 DIAGNOSIS — H16102 Unspecified superficial keratitis, left eye: Secondary | ICD-10-CM | POA: Diagnosis not present

## 2017-05-21 ENCOUNTER — Telehealth: Payer: Self-pay

## 2017-05-21 DIAGNOSIS — R2681 Unsteadiness on feet: Secondary | ICD-10-CM

## 2017-05-21 DIAGNOSIS — M4802 Spinal stenosis, cervical region: Secondary | ICD-10-CM

## 2017-05-21 DIAGNOSIS — Z9181 History of falling: Secondary | ICD-10-CM

## 2017-05-21 NOTE — Telephone Encounter (Signed)
I spoke with Gina Bolton to let her know that wheelchair order is ready for pick up. She stated that it would be picked up Monday.   Orders were placed in filing cabinet at front desk.

## 2017-05-21 NOTE — Telephone Encounter (Signed)
Letter received requesting an order for a walker with a seat and 4 small wheels. Patient will move to an [facility] in  [place], [place] in June. Patient was borrowing a walker and will now need her own since she is moving.   Dr.Green is out of office, letter and last OV note given to covering provider Sherrie Mustache, NP) to address

## 2017-05-21 NOTE — Telephone Encounter (Signed)
Message left on clinical intake voicemail:   [name] (patients daughter) left message requesting order for walker to be placed at the front desk for pick-up

## 2017-05-21 NOTE — Telephone Encounter (Signed)
This is okay to order

## 2017-05-26 DIAGNOSIS — H1851 Endothelial corneal dystrophy: Secondary | ICD-10-CM | POA: Diagnosis not present

## 2017-05-26 DIAGNOSIS — H04123 Dry eye syndrome of bilateral lacrimal glands: Secondary | ICD-10-CM | POA: Diagnosis not present

## 2017-05-26 DIAGNOSIS — Z947 Corneal transplant status: Secondary | ICD-10-CM | POA: Diagnosis not present

## 2017-05-26 DIAGNOSIS — H16402 Unspecified corneal neovascularization, left eye: Secondary | ICD-10-CM | POA: Diagnosis not present

## 2017-05-27 DIAGNOSIS — Z85828 Personal history of other malignant neoplasm of skin: Secondary | ICD-10-CM | POA: Diagnosis not present

## 2017-05-27 DIAGNOSIS — L570 Actinic keratosis: Secondary | ICD-10-CM | POA: Diagnosis not present

## 2017-05-27 DIAGNOSIS — D1801 Hemangioma of skin and subcutaneous tissue: Secondary | ICD-10-CM | POA: Diagnosis not present

## 2017-05-27 DIAGNOSIS — L821 Other seborrheic keratosis: Secondary | ICD-10-CM | POA: Diagnosis not present

## 2017-05-27 DIAGNOSIS — D225 Melanocytic nevi of trunk: Secondary | ICD-10-CM | POA: Diagnosis not present

## 2017-06-04 DIAGNOSIS — F329 Major depressive disorder, single episode, unspecified: Secondary | ICD-10-CM | POA: Diagnosis not present

## 2017-06-04 DIAGNOSIS — M1990 Unspecified osteoarthritis, unspecified site: Secondary | ICD-10-CM | POA: Diagnosis not present

## 2017-06-04 DIAGNOSIS — M810 Age-related osteoporosis without current pathological fracture: Secondary | ICD-10-CM | POA: Diagnosis not present

## 2017-06-04 DIAGNOSIS — H04129 Dry eye syndrome of unspecified lacrimal gland: Secondary | ICD-10-CM | POA: Diagnosis not present

## 2017-06-04 DIAGNOSIS — I10 Essential (primary) hypertension: Secondary | ICD-10-CM | POA: Diagnosis not present

## 2017-06-08 ENCOUNTER — Other Ambulatory Visit: Payer: Self-pay | Admitting: Internal Medicine

## 2017-06-11 DIAGNOSIS — M419 Scoliosis, unspecified: Secondary | ICD-10-CM | POA: Diagnosis not present

## 2017-06-11 DIAGNOSIS — I10 Essential (primary) hypertension: Secondary | ICD-10-CM | POA: Diagnosis not present

## 2017-06-11 DIAGNOSIS — F329 Major depressive disorder, single episode, unspecified: Secondary | ICD-10-CM | POA: Diagnosis not present

## 2017-06-11 DIAGNOSIS — M810 Age-related osteoporosis without current pathological fracture: Secondary | ICD-10-CM | POA: Diagnosis not present

## 2017-06-11 DIAGNOSIS — M1990 Unspecified osteoarthritis, unspecified site: Secondary | ICD-10-CM | POA: Diagnosis not present

## 2017-07-01 ENCOUNTER — Other Ambulatory Visit: Payer: Self-pay | Admitting: Internal Medicine

## 2017-07-02 DIAGNOSIS — R2689 Other abnormalities of gait and mobility: Secondary | ICD-10-CM | POA: Diagnosis not present

## 2017-07-02 DIAGNOSIS — M1990 Unspecified osteoarthritis, unspecified site: Secondary | ICD-10-CM | POA: Diagnosis not present

## 2017-07-02 DIAGNOSIS — F329 Major depressive disorder, single episode, unspecified: Secondary | ICD-10-CM | POA: Diagnosis not present

## 2017-07-02 DIAGNOSIS — M419 Scoliosis, unspecified: Secondary | ICD-10-CM | POA: Diagnosis not present

## 2017-07-02 DIAGNOSIS — I10 Essential (primary) hypertension: Secondary | ICD-10-CM | POA: Diagnosis not present

## 2017-07-23 DIAGNOSIS — M1990 Unspecified osteoarthritis, unspecified site: Secondary | ICD-10-CM | POA: Diagnosis not present

## 2017-07-23 DIAGNOSIS — F329 Major depressive disorder, single episode, unspecified: Secondary | ICD-10-CM | POA: Diagnosis not present

## 2017-07-23 DIAGNOSIS — I10 Essential (primary) hypertension: Secondary | ICD-10-CM | POA: Diagnosis not present

## 2017-07-23 DIAGNOSIS — R413 Other amnesia: Secondary | ICD-10-CM | POA: Diagnosis not present

## 2017-07-23 DIAGNOSIS — F419 Anxiety disorder, unspecified: Secondary | ICD-10-CM | POA: Diagnosis not present

## 2017-07-30 DIAGNOSIS — M810 Age-related osteoporosis without current pathological fracture: Secondary | ICD-10-CM | POA: Diagnosis not present

## 2017-07-30 DIAGNOSIS — F329 Major depressive disorder, single episode, unspecified: Secondary | ICD-10-CM | POA: Diagnosis not present

## 2017-07-30 DIAGNOSIS — M1991 Primary osteoarthritis, unspecified site: Secondary | ICD-10-CM | POA: Diagnosis not present

## 2017-07-30 DIAGNOSIS — R413 Other amnesia: Secondary | ICD-10-CM | POA: Diagnosis not present

## 2017-07-30 DIAGNOSIS — M419 Scoliosis, unspecified: Secondary | ICD-10-CM | POA: Diagnosis not present

## 2017-07-30 DIAGNOSIS — M1990 Unspecified osteoarthritis, unspecified site: Secondary | ICD-10-CM | POA: Diagnosis not present

## 2017-07-30 DIAGNOSIS — I10 Essential (primary) hypertension: Secondary | ICD-10-CM | POA: Diagnosis not present

## 2017-07-30 DIAGNOSIS — M6281 Muscle weakness (generalized): Secondary | ICD-10-CM | POA: Diagnosis not present

## 2017-07-30 DIAGNOSIS — F419 Anxiety disorder, unspecified: Secondary | ICD-10-CM | POA: Diagnosis not present

## 2017-08-04 DIAGNOSIS — M419 Scoliosis, unspecified: Secondary | ICD-10-CM | POA: Diagnosis not present

## 2017-08-04 DIAGNOSIS — F329 Major depressive disorder, single episode, unspecified: Secondary | ICD-10-CM | POA: Diagnosis not present

## 2017-08-04 DIAGNOSIS — M810 Age-related osteoporosis without current pathological fracture: Secondary | ICD-10-CM | POA: Diagnosis not present

## 2017-08-04 DIAGNOSIS — M6281 Muscle weakness (generalized): Secondary | ICD-10-CM | POA: Diagnosis not present

## 2017-08-04 DIAGNOSIS — M1991 Primary osteoarthritis, unspecified site: Secondary | ICD-10-CM | POA: Diagnosis not present

## 2017-08-04 DIAGNOSIS — I10 Essential (primary) hypertension: Secondary | ICD-10-CM | POA: Diagnosis not present

## 2017-08-07 DIAGNOSIS — M1991 Primary osteoarthritis, unspecified site: Secondary | ICD-10-CM | POA: Diagnosis not present

## 2017-08-07 DIAGNOSIS — F329 Major depressive disorder, single episode, unspecified: Secondary | ICD-10-CM | POA: Diagnosis not present

## 2017-08-07 DIAGNOSIS — M6281 Muscle weakness (generalized): Secondary | ICD-10-CM | POA: Diagnosis not present

## 2017-08-07 DIAGNOSIS — M810 Age-related osteoporosis without current pathological fracture: Secondary | ICD-10-CM | POA: Diagnosis not present

## 2017-08-07 DIAGNOSIS — I10 Essential (primary) hypertension: Secondary | ICD-10-CM | POA: Diagnosis not present

## 2017-08-07 DIAGNOSIS — M419 Scoliosis, unspecified: Secondary | ICD-10-CM | POA: Diagnosis not present

## 2017-08-08 DIAGNOSIS — M419 Scoliosis, unspecified: Secondary | ICD-10-CM | POA: Diagnosis not present

## 2017-08-08 DIAGNOSIS — I10 Essential (primary) hypertension: Secondary | ICD-10-CM | POA: Diagnosis not present

## 2017-08-08 DIAGNOSIS — M1991 Primary osteoarthritis, unspecified site: Secondary | ICD-10-CM | POA: Diagnosis not present

## 2017-08-08 DIAGNOSIS — M6281 Muscle weakness (generalized): Secondary | ICD-10-CM | POA: Diagnosis not present

## 2017-08-08 DIAGNOSIS — M810 Age-related osteoporosis without current pathological fracture: Secondary | ICD-10-CM | POA: Diagnosis not present

## 2017-08-08 DIAGNOSIS — F329 Major depressive disorder, single episode, unspecified: Secondary | ICD-10-CM | POA: Diagnosis not present

## 2017-08-11 DIAGNOSIS — I10 Essential (primary) hypertension: Secondary | ICD-10-CM | POA: Diagnosis not present

## 2017-08-11 DIAGNOSIS — M810 Age-related osteoporosis without current pathological fracture: Secondary | ICD-10-CM | POA: Diagnosis not present

## 2017-08-11 DIAGNOSIS — M6281 Muscle weakness (generalized): Secondary | ICD-10-CM | POA: Diagnosis not present

## 2017-08-11 DIAGNOSIS — M419 Scoliosis, unspecified: Secondary | ICD-10-CM | POA: Diagnosis not present

## 2017-08-11 DIAGNOSIS — M1991 Primary osteoarthritis, unspecified site: Secondary | ICD-10-CM | POA: Diagnosis not present

## 2017-08-11 DIAGNOSIS — F329 Major depressive disorder, single episode, unspecified: Secondary | ICD-10-CM | POA: Diagnosis not present

## 2017-08-18 DIAGNOSIS — I10 Essential (primary) hypertension: Secondary | ICD-10-CM | POA: Diagnosis not present

## 2017-08-18 DIAGNOSIS — M6281 Muscle weakness (generalized): Secondary | ICD-10-CM | POA: Diagnosis not present

## 2017-08-18 DIAGNOSIS — M810 Age-related osteoporosis without current pathological fracture: Secondary | ICD-10-CM | POA: Diagnosis not present

## 2017-08-18 DIAGNOSIS — M1991 Primary osteoarthritis, unspecified site: Secondary | ICD-10-CM | POA: Diagnosis not present

## 2017-08-18 DIAGNOSIS — M419 Scoliosis, unspecified: Secondary | ICD-10-CM | POA: Diagnosis not present

## 2017-08-18 DIAGNOSIS — F329 Major depressive disorder, single episode, unspecified: Secondary | ICD-10-CM | POA: Diagnosis not present

## 2017-08-20 DIAGNOSIS — M6281 Muscle weakness (generalized): Secondary | ICD-10-CM | POA: Diagnosis not present

## 2017-08-20 DIAGNOSIS — M810 Age-related osteoporosis without current pathological fracture: Secondary | ICD-10-CM | POA: Diagnosis not present

## 2017-08-20 DIAGNOSIS — I10 Essential (primary) hypertension: Secondary | ICD-10-CM | POA: Diagnosis not present

## 2017-08-20 DIAGNOSIS — M1991 Primary osteoarthritis, unspecified site: Secondary | ICD-10-CM | POA: Diagnosis not present

## 2017-08-20 DIAGNOSIS — F329 Major depressive disorder, single episode, unspecified: Secondary | ICD-10-CM | POA: Diagnosis not present

## 2017-08-20 DIAGNOSIS — M419 Scoliosis, unspecified: Secondary | ICD-10-CM | POA: Diagnosis not present

## 2017-08-25 DIAGNOSIS — M6281 Muscle weakness (generalized): Secondary | ICD-10-CM | POA: Diagnosis not present

## 2017-08-25 DIAGNOSIS — M419 Scoliosis, unspecified: Secondary | ICD-10-CM | POA: Diagnosis not present

## 2017-08-25 DIAGNOSIS — M810 Age-related osteoporosis without current pathological fracture: Secondary | ICD-10-CM | POA: Diagnosis not present

## 2017-08-25 DIAGNOSIS — I10 Essential (primary) hypertension: Secondary | ICD-10-CM | POA: Diagnosis not present

## 2017-08-25 DIAGNOSIS — F329 Major depressive disorder, single episode, unspecified: Secondary | ICD-10-CM | POA: Diagnosis not present

## 2017-08-25 DIAGNOSIS — M1991 Primary osteoarthritis, unspecified site: Secondary | ICD-10-CM | POA: Diagnosis not present

## 2017-08-27 DIAGNOSIS — M810 Age-related osteoporosis without current pathological fracture: Secondary | ICD-10-CM | POA: Diagnosis not present

## 2017-08-27 DIAGNOSIS — M419 Scoliosis, unspecified: Secondary | ICD-10-CM | POA: Diagnosis not present

## 2017-08-27 DIAGNOSIS — M6281 Muscle weakness (generalized): Secondary | ICD-10-CM | POA: Diagnosis not present

## 2017-08-27 DIAGNOSIS — I10 Essential (primary) hypertension: Secondary | ICD-10-CM | POA: Diagnosis not present

## 2017-08-27 DIAGNOSIS — F329 Major depressive disorder, single episode, unspecified: Secondary | ICD-10-CM | POA: Diagnosis not present

## 2017-08-27 DIAGNOSIS — M1991 Primary osteoarthritis, unspecified site: Secondary | ICD-10-CM | POA: Diagnosis not present

## 2017-09-01 DIAGNOSIS — M6281 Muscle weakness (generalized): Secondary | ICD-10-CM | POA: Diagnosis not present

## 2017-09-01 DIAGNOSIS — F329 Major depressive disorder, single episode, unspecified: Secondary | ICD-10-CM | POA: Diagnosis not present

## 2017-09-01 DIAGNOSIS — I10 Essential (primary) hypertension: Secondary | ICD-10-CM | POA: Diagnosis not present

## 2017-09-01 DIAGNOSIS — M419 Scoliosis, unspecified: Secondary | ICD-10-CM | POA: Diagnosis not present

## 2017-09-01 DIAGNOSIS — M1991 Primary osteoarthritis, unspecified site: Secondary | ICD-10-CM | POA: Diagnosis not present

## 2017-09-01 DIAGNOSIS — M810 Age-related osteoporosis without current pathological fracture: Secondary | ICD-10-CM | POA: Diagnosis not present

## 2017-09-03 DIAGNOSIS — M810 Age-related osteoporosis without current pathological fracture: Secondary | ICD-10-CM | POA: Diagnosis not present

## 2017-09-03 DIAGNOSIS — F329 Major depressive disorder, single episode, unspecified: Secondary | ICD-10-CM | POA: Diagnosis not present

## 2017-09-03 DIAGNOSIS — M1991 Primary osteoarthritis, unspecified site: Secondary | ICD-10-CM | POA: Diagnosis not present

## 2017-09-03 DIAGNOSIS — M419 Scoliosis, unspecified: Secondary | ICD-10-CM | POA: Diagnosis not present

## 2017-09-03 DIAGNOSIS — M6281 Muscle weakness (generalized): Secondary | ICD-10-CM | POA: Diagnosis not present

## 2017-09-03 DIAGNOSIS — I10 Essential (primary) hypertension: Secondary | ICD-10-CM | POA: Diagnosis not present

## 2017-09-08 DIAGNOSIS — F329 Major depressive disorder, single episode, unspecified: Secondary | ICD-10-CM | POA: Diagnosis not present

## 2017-09-08 DIAGNOSIS — M1991 Primary osteoarthritis, unspecified site: Secondary | ICD-10-CM | POA: Diagnosis not present

## 2017-09-08 DIAGNOSIS — I10 Essential (primary) hypertension: Secondary | ICD-10-CM | POA: Diagnosis not present

## 2017-09-08 DIAGNOSIS — M6281 Muscle weakness (generalized): Secondary | ICD-10-CM | POA: Diagnosis not present

## 2017-09-08 DIAGNOSIS — M810 Age-related osteoporosis without current pathological fracture: Secondary | ICD-10-CM | POA: Diagnosis not present

## 2017-09-08 DIAGNOSIS — M419 Scoliosis, unspecified: Secondary | ICD-10-CM | POA: Diagnosis not present

## 2017-09-10 DIAGNOSIS — Z23 Encounter for immunization: Secondary | ICD-10-CM | POA: Diagnosis not present

## 2017-09-11 DIAGNOSIS — M1991 Primary osteoarthritis, unspecified site: Secondary | ICD-10-CM | POA: Diagnosis not present

## 2017-09-11 DIAGNOSIS — M6281 Muscle weakness (generalized): Secondary | ICD-10-CM | POA: Diagnosis not present

## 2017-09-11 DIAGNOSIS — F329 Major depressive disorder, single episode, unspecified: Secondary | ICD-10-CM | POA: Diagnosis not present

## 2017-09-11 DIAGNOSIS — I10 Essential (primary) hypertension: Secondary | ICD-10-CM | POA: Diagnosis not present

## 2017-09-11 DIAGNOSIS — M810 Age-related osteoporosis without current pathological fracture: Secondary | ICD-10-CM | POA: Diagnosis not present

## 2017-09-11 DIAGNOSIS — M419 Scoliosis, unspecified: Secondary | ICD-10-CM | POA: Diagnosis not present

## 2017-09-15 DIAGNOSIS — M810 Age-related osteoporosis without current pathological fracture: Secondary | ICD-10-CM | POA: Diagnosis not present

## 2017-09-15 DIAGNOSIS — M6281 Muscle weakness (generalized): Secondary | ICD-10-CM | POA: Diagnosis not present

## 2017-09-15 DIAGNOSIS — F329 Major depressive disorder, single episode, unspecified: Secondary | ICD-10-CM | POA: Diagnosis not present

## 2017-09-15 DIAGNOSIS — M1991 Primary osteoarthritis, unspecified site: Secondary | ICD-10-CM | POA: Diagnosis not present

## 2017-09-15 DIAGNOSIS — M419 Scoliosis, unspecified: Secondary | ICD-10-CM | POA: Diagnosis not present

## 2017-09-15 DIAGNOSIS — I10 Essential (primary) hypertension: Secondary | ICD-10-CM | POA: Diagnosis not present

## 2017-09-17 DIAGNOSIS — M6281 Muscle weakness (generalized): Secondary | ICD-10-CM | POA: Diagnosis not present

## 2017-09-17 DIAGNOSIS — I10 Essential (primary) hypertension: Secondary | ICD-10-CM | POA: Diagnosis not present

## 2017-09-17 DIAGNOSIS — M419 Scoliosis, unspecified: Secondary | ICD-10-CM | POA: Diagnosis not present

## 2017-09-17 DIAGNOSIS — F329 Major depressive disorder, single episode, unspecified: Secondary | ICD-10-CM | POA: Diagnosis not present

## 2017-09-17 DIAGNOSIS — M810 Age-related osteoporosis without current pathological fracture: Secondary | ICD-10-CM | POA: Diagnosis not present

## 2017-09-17 DIAGNOSIS — M1991 Primary osteoarthritis, unspecified site: Secondary | ICD-10-CM | POA: Diagnosis not present

## 2017-09-22 DIAGNOSIS — F329 Major depressive disorder, single episode, unspecified: Secondary | ICD-10-CM | POA: Diagnosis not present

## 2017-09-22 DIAGNOSIS — M1991 Primary osteoarthritis, unspecified site: Secondary | ICD-10-CM | POA: Diagnosis not present

## 2017-09-22 DIAGNOSIS — M810 Age-related osteoporosis without current pathological fracture: Secondary | ICD-10-CM | POA: Diagnosis not present

## 2017-09-22 DIAGNOSIS — M419 Scoliosis, unspecified: Secondary | ICD-10-CM | POA: Diagnosis not present

## 2017-09-22 DIAGNOSIS — M6281 Muscle weakness (generalized): Secondary | ICD-10-CM | POA: Diagnosis not present

## 2017-09-22 DIAGNOSIS — I10 Essential (primary) hypertension: Secondary | ICD-10-CM | POA: Diagnosis not present

## 2017-09-24 DIAGNOSIS — F329 Major depressive disorder, single episode, unspecified: Secondary | ICD-10-CM | POA: Diagnosis not present

## 2017-09-24 DIAGNOSIS — M6281 Muscle weakness (generalized): Secondary | ICD-10-CM | POA: Diagnosis not present

## 2017-09-24 DIAGNOSIS — M419 Scoliosis, unspecified: Secondary | ICD-10-CM | POA: Diagnosis not present

## 2017-09-24 DIAGNOSIS — M1991 Primary osteoarthritis, unspecified site: Secondary | ICD-10-CM | POA: Diagnosis not present

## 2017-09-24 DIAGNOSIS — M810 Age-related osteoporosis without current pathological fracture: Secondary | ICD-10-CM | POA: Diagnosis not present

## 2017-09-24 DIAGNOSIS — I10 Essential (primary) hypertension: Secondary | ICD-10-CM | POA: Diagnosis not present

## 2017-09-28 DIAGNOSIS — L509 Urticaria, unspecified: Secondary | ICD-10-CM | POA: Diagnosis not present

## 2017-09-28 DIAGNOSIS — T7840XA Allergy, unspecified, initial encounter: Secondary | ICD-10-CM | POA: Diagnosis not present

## 2017-09-28 DIAGNOSIS — I10 Essential (primary) hypertension: Secondary | ICD-10-CM | POA: Diagnosis not present

## 2017-09-29 DIAGNOSIS — B351 Tinea unguium: Secondary | ICD-10-CM | POA: Diagnosis not present

## 2017-09-29 DIAGNOSIS — L603 Nail dystrophy: Secondary | ICD-10-CM | POA: Diagnosis not present

## 2017-09-29 DIAGNOSIS — I739 Peripheral vascular disease, unspecified: Secondary | ICD-10-CM | POA: Diagnosis not present

## 2017-10-01 DIAGNOSIS — R413 Other amnesia: Secondary | ICD-10-CM | POA: Diagnosis not present

## 2017-10-01 DIAGNOSIS — I10 Essential (primary) hypertension: Secondary | ICD-10-CM | POA: Diagnosis not present

## 2017-10-01 DIAGNOSIS — M1990 Unspecified osteoarthritis, unspecified site: Secondary | ICD-10-CM | POA: Diagnosis not present

## 2017-10-01 DIAGNOSIS — M419 Scoliosis, unspecified: Secondary | ICD-10-CM | POA: Diagnosis not present

## 2017-10-01 DIAGNOSIS — F419 Anxiety disorder, unspecified: Secondary | ICD-10-CM | POA: Diagnosis not present

## 2017-10-15 DIAGNOSIS — M1990 Unspecified osteoarthritis, unspecified site: Secondary | ICD-10-CM | POA: Diagnosis not present

## 2017-10-15 DIAGNOSIS — I10 Essential (primary) hypertension: Secondary | ICD-10-CM | POA: Diagnosis not present

## 2017-10-15 DIAGNOSIS — R413 Other amnesia: Secondary | ICD-10-CM | POA: Diagnosis not present

## 2017-10-15 DIAGNOSIS — M25519 Pain in unspecified shoulder: Secondary | ICD-10-CM | POA: Diagnosis not present

## 2017-10-15 DIAGNOSIS — L821 Other seborrheic keratosis: Secondary | ICD-10-CM | POA: Diagnosis not present

## 2017-10-16 DIAGNOSIS — H903 Sensorineural hearing loss, bilateral: Secondary | ICD-10-CM | POA: Diagnosis not present

## 2017-10-22 DIAGNOSIS — H903 Sensorineural hearing loss, bilateral: Secondary | ICD-10-CM | POA: Diagnosis not present

## 2017-12-08 DIAGNOSIS — B351 Tinea unguium: Secondary | ICD-10-CM | POA: Diagnosis not present

## 2017-12-08 DIAGNOSIS — I739 Peripheral vascular disease, unspecified: Secondary | ICD-10-CM | POA: Diagnosis not present

## 2017-12-08 DIAGNOSIS — L603 Nail dystrophy: Secondary | ICD-10-CM | POA: Diagnosis not present

## 2017-12-09 DIAGNOSIS — R918 Other nonspecific abnormal finding of lung field: Secondary | ICD-10-CM | POA: Diagnosis not present

## 2017-12-10 DIAGNOSIS — R413 Other amnesia: Secondary | ICD-10-CM | POA: Diagnosis not present

## 2017-12-10 DIAGNOSIS — I10 Essential (primary) hypertension: Secondary | ICD-10-CM | POA: Diagnosis not present

## 2017-12-10 DIAGNOSIS — J189 Pneumonia, unspecified organism: Secondary | ICD-10-CM | POA: Diagnosis not present

## 2017-12-10 DIAGNOSIS — M1990 Unspecified osteoarthritis, unspecified site: Secondary | ICD-10-CM | POA: Diagnosis not present

## 2017-12-10 DIAGNOSIS — M419 Scoliosis, unspecified: Secondary | ICD-10-CM | POA: Diagnosis not present

## 2017-12-16 DIAGNOSIS — I517 Cardiomegaly: Secondary | ICD-10-CM | POA: Diagnosis not present

## 2017-12-16 DIAGNOSIS — I509 Heart failure, unspecified: Secondary | ICD-10-CM | POA: Diagnosis not present

## 2017-12-16 DIAGNOSIS — J90 Pleural effusion, not elsewhere classified: Secondary | ICD-10-CM | POA: Diagnosis not present

## 2017-12-19 DIAGNOSIS — J189 Pneumonia, unspecified organism: Secondary | ICD-10-CM | POA: Diagnosis not present

## 2017-12-19 DIAGNOSIS — I509 Heart failure, unspecified: Secondary | ICD-10-CM | POA: Diagnosis not present

## 2017-12-21 DIAGNOSIS — I509 Heart failure, unspecified: Secondary | ICD-10-CM | POA: Diagnosis not present

## 2017-12-21 DIAGNOSIS — J189 Pneumonia, unspecified organism: Secondary | ICD-10-CM | POA: Diagnosis not present

## 2017-12-22 DIAGNOSIS — I509 Heart failure, unspecified: Secondary | ICD-10-CM | POA: Diagnosis not present

## 2017-12-22 DIAGNOSIS — J189 Pneumonia, unspecified organism: Secondary | ICD-10-CM | POA: Diagnosis not present

## 2017-12-23 DIAGNOSIS — J189 Pneumonia, unspecified organism: Secondary | ICD-10-CM | POA: Diagnosis not present

## 2017-12-23 DIAGNOSIS — I509 Heart failure, unspecified: Secondary | ICD-10-CM | POA: Diagnosis not present

## 2017-12-24 DIAGNOSIS — I509 Heart failure, unspecified: Secondary | ICD-10-CM | POA: Diagnosis not present

## 2017-12-24 DIAGNOSIS — J189 Pneumonia, unspecified organism: Secondary | ICD-10-CM | POA: Diagnosis not present

## 2017-12-25 DIAGNOSIS — I509 Heart failure, unspecified: Secondary | ICD-10-CM | POA: Diagnosis not present

## 2017-12-25 DIAGNOSIS — J189 Pneumonia, unspecified organism: Secondary | ICD-10-CM | POA: Diagnosis not present

## 2017-12-30 DIAGNOSIS — I509 Heart failure, unspecified: Secondary | ICD-10-CM | POA: Diagnosis not present

## 2017-12-30 DIAGNOSIS — J189 Pneumonia, unspecified organism: Secondary | ICD-10-CM | POA: Diagnosis not present

## 2017-12-31 DIAGNOSIS — I509 Heart failure, unspecified: Secondary | ICD-10-CM | POA: Diagnosis not present

## 2017-12-31 DIAGNOSIS — J189 Pneumonia, unspecified organism: Secondary | ICD-10-CM | POA: Diagnosis not present

## 2018-01-04 DIAGNOSIS — J189 Pneumonia, unspecified organism: Secondary | ICD-10-CM | POA: Diagnosis not present

## 2018-01-04 DIAGNOSIS — I509 Heart failure, unspecified: Secondary | ICD-10-CM | POA: Diagnosis not present

## 2018-01-07 DIAGNOSIS — J189 Pneumonia, unspecified organism: Secondary | ICD-10-CM | POA: Diagnosis not present

## 2018-01-07 DIAGNOSIS — I509 Heart failure, unspecified: Secondary | ICD-10-CM | POA: Diagnosis not present

## 2018-01-12 DIAGNOSIS — J189 Pneumonia, unspecified organism: Secondary | ICD-10-CM | POA: Diagnosis not present

## 2018-01-12 DIAGNOSIS — I509 Heart failure, unspecified: Secondary | ICD-10-CM | POA: Diagnosis not present

## 2018-01-14 DIAGNOSIS — J189 Pneumonia, unspecified organism: Secondary | ICD-10-CM | POA: Diagnosis not present

## 2018-01-14 DIAGNOSIS — I509 Heart failure, unspecified: Secondary | ICD-10-CM | POA: Diagnosis not present

## 2018-01-17 DIAGNOSIS — I509 Heart failure, unspecified: Secondary | ICD-10-CM | POA: Diagnosis not present

## 2018-01-17 DIAGNOSIS — J189 Pneumonia, unspecified organism: Secondary | ICD-10-CM | POA: Diagnosis not present

## 2018-01-20 DIAGNOSIS — I509 Heart failure, unspecified: Secondary | ICD-10-CM | POA: Diagnosis not present

## 2018-01-20 DIAGNOSIS — J189 Pneumonia, unspecified organism: Secondary | ICD-10-CM | POA: Diagnosis not present

## 2018-01-21 DIAGNOSIS — J189 Pneumonia, unspecified organism: Secondary | ICD-10-CM | POA: Diagnosis not present

## 2018-01-21 DIAGNOSIS — I509 Heart failure, unspecified: Secondary | ICD-10-CM | POA: Diagnosis not present

## 2018-01-22 DIAGNOSIS — I509 Heart failure, unspecified: Secondary | ICD-10-CM | POA: Diagnosis not present

## 2018-01-22 DIAGNOSIS — J189 Pneumonia, unspecified organism: Secondary | ICD-10-CM | POA: Diagnosis not present

## 2018-01-28 DIAGNOSIS — I509 Heart failure, unspecified: Secondary | ICD-10-CM | POA: Diagnosis not present

## 2018-01-28 DIAGNOSIS — J189 Pneumonia, unspecified organism: Secondary | ICD-10-CM | POA: Diagnosis not present

## 2018-01-29 DIAGNOSIS — I509 Heart failure, unspecified: Secondary | ICD-10-CM | POA: Diagnosis not present

## 2018-01-29 DIAGNOSIS — J189 Pneumonia, unspecified organism: Secondary | ICD-10-CM | POA: Diagnosis not present

## 2018-02-02 DIAGNOSIS — J189 Pneumonia, unspecified organism: Secondary | ICD-10-CM | POA: Diagnosis not present

## 2018-02-02 DIAGNOSIS — I509 Heart failure, unspecified: Secondary | ICD-10-CM | POA: Diagnosis not present

## 2018-02-04 DIAGNOSIS — I509 Heart failure, unspecified: Secondary | ICD-10-CM | POA: Diagnosis not present

## 2018-02-04 DIAGNOSIS — J189 Pneumonia, unspecified organism: Secondary | ICD-10-CM | POA: Diagnosis not present

## 2018-02-07 DIAGNOSIS — J189 Pneumonia, unspecified organism: Secondary | ICD-10-CM | POA: Diagnosis not present

## 2018-02-07 DIAGNOSIS — I509 Heart failure, unspecified: Secondary | ICD-10-CM | POA: Diagnosis not present

## 2018-02-09 DIAGNOSIS — I509 Heart failure, unspecified: Secondary | ICD-10-CM | POA: Diagnosis not present

## 2018-02-09 DIAGNOSIS — J189 Pneumonia, unspecified organism: Secondary | ICD-10-CM | POA: Diagnosis not present

## 2018-02-11 DIAGNOSIS — J189 Pneumonia, unspecified organism: Secondary | ICD-10-CM | POA: Diagnosis not present

## 2018-02-11 DIAGNOSIS — I509 Heart failure, unspecified: Secondary | ICD-10-CM | POA: Diagnosis not present

## 2018-02-15 ENCOUNTER — Other Ambulatory Visit: Payer: Self-pay | Admitting: Internal Medicine

## 2018-02-15 MED ORDER — SODIUM CHLORIDE (HYPERTONIC) 5 % OP SOLN: 1.0000 [drp] | Freq: Two times a day (BID) | OPHTHALMIC | 0 refills | Status: AC

## 2018-02-17 DIAGNOSIS — J189 Pneumonia, unspecified organism: Secondary | ICD-10-CM | POA: Diagnosis not present

## 2018-02-17 DIAGNOSIS — I509 Heart failure, unspecified: Secondary | ICD-10-CM | POA: Diagnosis not present

## 2018-02-18 DIAGNOSIS — J189 Pneumonia, unspecified organism: Secondary | ICD-10-CM | POA: Diagnosis not present

## 2018-02-18 DIAGNOSIS — I509 Heart failure, unspecified: Secondary | ICD-10-CM | POA: Diagnosis not present

## 2018-02-25 DIAGNOSIS — L603 Nail dystrophy: Secondary | ICD-10-CM | POA: Diagnosis not present

## 2018-02-25 DIAGNOSIS — I739 Peripheral vascular disease, unspecified: Secondary | ICD-10-CM | POA: Diagnosis not present

## 2018-02-25 DIAGNOSIS — B351 Tinea unguium: Secondary | ICD-10-CM | POA: Diagnosis not present

## 2018-03-23 ENCOUNTER — Other Ambulatory Visit: Payer: Self-pay | Admitting: Internal Medicine

## 2018-03-30 ENCOUNTER — Other Ambulatory Visit: Payer: Self-pay | Admitting: Internal Medicine

## 2018-04-29 DIAGNOSIS — F329 Major depressive disorder, single episode, unspecified: Secondary | ICD-10-CM | POA: Diagnosis not present

## 2018-04-29 DIAGNOSIS — F419 Anxiety disorder, unspecified: Secondary | ICD-10-CM | POA: Diagnosis not present

## 2018-04-29 DIAGNOSIS — R413 Other amnesia: Secondary | ICD-10-CM | POA: Diagnosis not present

## 2018-04-29 DIAGNOSIS — I10 Essential (primary) hypertension: Secondary | ICD-10-CM | POA: Diagnosis not present

## 2018-05-06 DIAGNOSIS — M1990 Unspecified osteoarthritis, unspecified site: Secondary | ICD-10-CM | POA: Diagnosis not present

## 2018-05-06 DIAGNOSIS — F329 Major depressive disorder, single episode, unspecified: Secondary | ICD-10-CM | POA: Diagnosis not present

## 2018-05-06 DIAGNOSIS — F419 Anxiety disorder, unspecified: Secondary | ICD-10-CM | POA: Diagnosis not present

## 2018-05-06 DIAGNOSIS — E785 Hyperlipidemia, unspecified: Secondary | ICD-10-CM | POA: Diagnosis not present

## 2018-05-14 DIAGNOSIS — H04123 Dry eye syndrome of bilateral lacrimal glands: Secondary | ICD-10-CM | POA: Diagnosis not present

## 2018-05-14 DIAGNOSIS — H35033 Hypertensive retinopathy, bilateral: Secondary | ICD-10-CM | POA: Diagnosis not present

## 2018-05-14 DIAGNOSIS — H1851 Endothelial corneal dystrophy: Secondary | ICD-10-CM | POA: Diagnosis not present

## 2018-05-14 DIAGNOSIS — Z947 Corneal transplant status: Secondary | ICD-10-CM | POA: Diagnosis not present

## 2018-05-14 DIAGNOSIS — H353132 Nonexudative age-related macular degeneration, bilateral, intermediate dry stage: Secondary | ICD-10-CM | POA: Diagnosis not present

## 2018-07-20 DIAGNOSIS — B351 Tinea unguium: Secondary | ICD-10-CM | POA: Diagnosis not present

## 2018-07-20 DIAGNOSIS — L603 Nail dystrophy: Secondary | ICD-10-CM | POA: Diagnosis not present

## 2018-07-20 DIAGNOSIS — I739 Peripheral vascular disease, unspecified: Secondary | ICD-10-CM | POA: Diagnosis not present

## 2022-03-22 DEATH — deceased
